# Patient Record
Sex: Male | Born: 1970 | Race: Black or African American | Hispanic: No | Marital: Married | Smoking: Current some day smoker
Health system: Southern US, Community
[De-identification: ages and names within clinical notes are randomized; demographics above are authoritative.]

## PROBLEM LIST (undated history)

## (undated) DIAGNOSIS — R519 Headache, unspecified: Secondary | ICD-10-CM

## (undated) DIAGNOSIS — T8859XA Other complications of anesthesia, initial encounter: Secondary | ICD-10-CM

## (undated) DIAGNOSIS — R51 Headache: Secondary | ICD-10-CM

## (undated) DIAGNOSIS — K219 Gastro-esophageal reflux disease without esophagitis: Secondary | ICD-10-CM

## (undated) DIAGNOSIS — IMO0001 Reserved for inherently not codable concepts without codable children: Secondary | ICD-10-CM

## (undated) DIAGNOSIS — I219 Acute myocardial infarction, unspecified: Secondary | ICD-10-CM

## (undated) DIAGNOSIS — Z9889 Other specified postprocedural states: Secondary | ICD-10-CM

## (undated) DIAGNOSIS — R112 Nausea with vomiting, unspecified: Secondary | ICD-10-CM

## (undated) DIAGNOSIS — R079 Chest pain, unspecified: Secondary | ICD-10-CM

## (undated) DIAGNOSIS — I10 Essential (primary) hypertension: Secondary | ICD-10-CM

## (undated) DIAGNOSIS — J189 Pneumonia, unspecified organism: Secondary | ICD-10-CM

## (undated) HISTORY — DX: Headache, unspecified: R51.9

## (undated) HISTORY — DX: Headache: R51

## (undated) HISTORY — PX: EYE SURGERY: SHX253

## (undated) HISTORY — PX: ESOPHAGOGASTRODUODENOSCOPY: SHX1529

## (undated) HISTORY — DX: Chest pain, unspecified: R07.9

---

## 1998-06-26 ENCOUNTER — Emergency Department (HOSPITAL_COMMUNITY): Admission: EM | Admit: 1998-06-26 | Discharge: 1998-06-26 | Payer: Self-pay | Admitting: Emergency Medicine

## 1999-02-26 ENCOUNTER — Emergency Department (HOSPITAL_COMMUNITY): Admission: EM | Admit: 1999-02-26 | Discharge: 1999-02-26 | Payer: Self-pay | Admitting: Emergency Medicine

## 1999-02-26 ENCOUNTER — Encounter: Payer: Self-pay | Admitting: Emergency Medicine

## 1999-03-03 ENCOUNTER — Emergency Department (HOSPITAL_COMMUNITY): Admission: EM | Admit: 1999-03-03 | Discharge: 1999-03-04 | Payer: Self-pay | Admitting: Emergency Medicine

## 1999-10-30 ENCOUNTER — Emergency Department (HOSPITAL_COMMUNITY): Admission: EM | Admit: 1999-10-30 | Discharge: 1999-10-30 | Payer: Self-pay | Admitting: Emergency Medicine

## 2000-03-07 ENCOUNTER — Emergency Department (HOSPITAL_COMMUNITY): Admission: EM | Admit: 2000-03-07 | Discharge: 2000-03-07 | Payer: Self-pay | Admitting: *Deleted

## 2001-01-09 ENCOUNTER — Emergency Department (HOSPITAL_COMMUNITY): Admission: EM | Admit: 2001-01-09 | Discharge: 2001-01-09 | Payer: Self-pay | Admitting: Emergency Medicine

## 2001-09-30 ENCOUNTER — Emergency Department (HOSPITAL_COMMUNITY): Admission: EM | Admit: 2001-09-30 | Discharge: 2001-09-30 | Payer: Self-pay | Admitting: Emergency Medicine

## 2002-01-24 ENCOUNTER — Emergency Department (HOSPITAL_COMMUNITY): Admission: EM | Admit: 2002-01-24 | Discharge: 2002-01-24 | Payer: Self-pay | Admitting: Emergency Medicine

## 2002-06-04 ENCOUNTER — Emergency Department (HOSPITAL_COMMUNITY): Admission: EM | Admit: 2002-06-04 | Discharge: 2002-06-05 | Payer: Self-pay | Admitting: Emergency Medicine

## 2002-06-25 ENCOUNTER — Emergency Department (HOSPITAL_COMMUNITY): Admission: EM | Admit: 2002-06-25 | Discharge: 2002-06-25 | Payer: Self-pay | Admitting: Emergency Medicine

## 2003-03-12 ENCOUNTER — Encounter: Payer: Self-pay | Admitting: Emergency Medicine

## 2003-03-12 ENCOUNTER — Emergency Department (HOSPITAL_COMMUNITY): Admission: EM | Admit: 2003-03-12 | Discharge: 2003-03-12 | Payer: Self-pay | Admitting: Emergency Medicine

## 2003-05-05 ENCOUNTER — Encounter: Payer: Self-pay | Admitting: Emergency Medicine

## 2003-05-05 ENCOUNTER — Emergency Department (HOSPITAL_COMMUNITY): Admission: EM | Admit: 2003-05-05 | Discharge: 2003-05-06 | Payer: Self-pay | Admitting: Emergency Medicine

## 2004-01-17 ENCOUNTER — Emergency Department (HOSPITAL_COMMUNITY): Admission: EM | Admit: 2004-01-17 | Discharge: 2004-01-17 | Payer: Self-pay | Admitting: Emergency Medicine

## 2005-06-04 ENCOUNTER — Emergency Department (HOSPITAL_COMMUNITY): Admission: EM | Admit: 2005-06-04 | Discharge: 2005-06-04 | Payer: Self-pay | Admitting: Emergency Medicine

## 2005-09-10 DIAGNOSIS — I219 Acute myocardial infarction, unspecified: Secondary | ICD-10-CM | POA: Insufficient documentation

## 2005-09-10 HISTORY — DX: Acute myocardial infarction, unspecified: I21.9

## 2005-09-23 ENCOUNTER — Emergency Department (HOSPITAL_COMMUNITY): Admission: EM | Admit: 2005-09-23 | Discharge: 2005-09-23 | Payer: Self-pay | Admitting: Emergency Medicine

## 2006-03-31 ENCOUNTER — Emergency Department (HOSPITAL_COMMUNITY): Admission: EM | Admit: 2006-03-31 | Discharge: 2006-03-31 | Payer: Self-pay | Admitting: Emergency Medicine

## 2006-05-04 ENCOUNTER — Emergency Department (HOSPITAL_COMMUNITY): Admission: EM | Admit: 2006-05-04 | Discharge: 2006-05-04 | Payer: Self-pay | Admitting: Family Medicine

## 2007-01-09 ENCOUNTER — Emergency Department (HOSPITAL_COMMUNITY): Admission: EM | Admit: 2007-01-09 | Discharge: 2007-01-09 | Payer: Self-pay | Admitting: Emergency Medicine

## 2007-05-19 ENCOUNTER — Emergency Department (HOSPITAL_COMMUNITY): Admission: EM | Admit: 2007-05-19 | Discharge: 2007-05-19 | Payer: Self-pay | Admitting: Emergency Medicine

## 2007-08-08 ENCOUNTER — Emergency Department (HOSPITAL_COMMUNITY): Admission: EM | Admit: 2007-08-08 | Discharge: 2007-08-09 | Payer: Self-pay | Admitting: Emergency Medicine

## 2007-08-20 ENCOUNTER — Ambulatory Visit (HOSPITAL_COMMUNITY): Admission: RE | Admit: 2007-08-20 | Discharge: 2007-08-20 | Payer: Self-pay | Admitting: Cardiology

## 2007-09-07 ENCOUNTER — Emergency Department (HOSPITAL_COMMUNITY): Admission: EM | Admit: 2007-09-07 | Discharge: 2007-09-07 | Payer: Self-pay | Admitting: Emergency Medicine

## 2007-09-08 ENCOUNTER — Ambulatory Visit (HOSPITAL_COMMUNITY): Admission: RE | Admit: 2007-09-08 | Discharge: 2007-09-08 | Payer: Self-pay | Admitting: Cardiology

## 2007-09-08 HISTORY — PX: CARDIAC CATHETERIZATION: SHX172

## 2008-06-29 ENCOUNTER — Emergency Department (HOSPITAL_COMMUNITY): Admission: EM | Admit: 2008-06-29 | Discharge: 2008-06-29 | Payer: Self-pay | Admitting: Emergency Medicine

## 2009-04-18 ENCOUNTER — Emergency Department (HOSPITAL_BASED_OUTPATIENT_CLINIC_OR_DEPARTMENT_OTHER): Admission: EM | Admit: 2009-04-18 | Discharge: 2009-04-18 | Payer: Self-pay | Admitting: Emergency Medicine

## 2009-04-18 ENCOUNTER — Ambulatory Visit: Payer: Self-pay | Admitting: Radiology

## 2011-01-23 NOTE — Cardiovascular Report (Signed)
NAME:  Liu, Noah NO.:  [id number]   MEDICAL RECORD NO.:  [id number]          PATIENT TYPE:  OIB   LOCATION:  2899                         FACILITY:  [facility]   PHYSICIAN:  Ritta Slot, MD     DATE OF BIRTH:  [birth year]   DATE OF PROCEDURE:  09/08/2007  DATE OF DISCHARGE:                            CARDIAC CATHETERIZATION   DIAGNOSTIC CARDIAC CATHETERIZATION PERFORMED AT [facility]   INDICATIONS:  Mr. Ottavio Norem is a 40 year old African American  gentleman who is a heavy smoker with complaint of chest pain for the  past month.  He underwent nuclear perfusion stress testing at  [facility], which did demonstrate a small  area of stress-induced ischemia anterior wall and the mid portion with  mild thinning of the inferior wall and septal hypokinesis, which was  nonspecific.  The calculated ejection fraction was 77%.  In view of the  fact that he is a smoker with chest pain and abnormal stress test, he  was brought to the cardiac cauterization laboratory for further  investigation of coronary disease.   PROCEDURES PERFORMED:  1. Retrograde left heart catheterization.  2. Selective visualization of the coronary arteries.  3. Left ventriculography.   APPROACH:  RFA.   ANESTHESIA:  Local with 10 mL of 1% lidocaine.  Sedation used 1 mg of  Versed, 25 mcg of fentanyl, intravenously.   PROCEDURE:  The patient was prepared and draped in the usual sterile  fashion.  The right groin was shaved and prepared with antibacterial  solution.  It was numbed with 10 mL of 1% lidocaine over the RFA. The  RFA was accessed using a Cook needle x1 stick.  A J-wire was passed  through the Noble needle into the RFA.  The needle was then removed and  over the J-wire was placed a 6 [place] sheath.  Through the 6 French  sheath was placed the JL4 catheter that was engaged into the left main  coronary artery.  The left main coronary artery  system was visualized in  the AP cranial, AP caudal and LAO cranial views.  The JL4 catheter was  then exchanged over the J-wire for a JR4 catheter.  The JR4 catheter was  engaged into the RCA.  The RCA origin was found to be high anterior  takeoff.  The RCA was visualized in the LAO cranial and RAO cranial  views.  The JR4 catheter was then exchanged over the J-wire for an  angled pigtail.  The angled pigtail was placed into the left ventricle  without difficulty.  Contrast of 40 mL was injected at the rate of 13 mL  a second into the left ventricle.  The left ventricle was visualized in  RAO oblique 45-degree ankle.   FINDINGS:  1. Pressures.  The AO pressure was 96/64/6/80.  LV pressure was      96/11/08/09.  The PBV was 96/6/12.  The PBA was 97/65/82.  No      gradient was found across the aortic valve by pullback technique.  2. Coronary arteries:  The right coronary artery was found  to be small      but long and dominant in supplying the PDA.  There is no      significant disease in the right coronary artery.  The left main      coronary artery was large and bifurcated into the left circumflex      and a left anterior descending artery.  The left main was free of      any disease.  The LAD artery was long, wrapped around the apex and      was free of any disease.  It gave rise to a large diagonal.  It was      also free of any disease.  The left circumflex system was large and      also codominant.  It gave rise to a large OM branch.  Neither of      these vessels had any significant coronary artery disease in them.  3. Left ventriculography demonstrated a normal ejection fraction with      EF of 65%.  Mitral regurgitation was noted and no wall motion      abnormality was seen.   IMPRESSION:  A 40 year old gentleman with essentially normal coronary  arteries and normal left ventricular function.   PLAN:  He can go home later today.  I will follow up with him in the  office in 1  week.      Ritta Slot, MD  Electronically Signed     HS/MEDQ  D:  09/08/2007  T:  09/08/2007  Job:  [id number]

## 2011-06-12 LAB — POCT RAPID STREP A: Streptococcus, Group A Screen (Direct): NEGATIVE

## 2011-06-19 LAB — DIFFERENTIAL
Basophils Relative: 1
Eosinophils Relative: 4
Lymphocytes Relative: 53 — ABNORMAL HIGH
Lymphs Abs: 3.5
Monocytes Absolute: 0.5
Neutrophils Relative %: 35 — ABNORMAL LOW

## 2011-06-19 LAB — RAPID URINE DRUG SCREEN, HOSP PERFORMED
Amphetamines: NOT DETECTED
Barbiturates: NOT DETECTED
Benzodiazepines: NOT DETECTED
Cocaine: NOT DETECTED
Tetrahydrocannabinol: NOT DETECTED

## 2011-06-19 LAB — D-DIMER, QUANTITATIVE: D-Dimer, Quant: <0.22

## 2011-06-19 LAB — CBC
MCHC: 33.3
MCV: 83
RDW: 13.9

## 2011-06-19 LAB — URINALYSIS, ROUTINE W REFLEX MICROSCOPIC
Nitrite: NEGATIVE
Specific Gravity, Urine: 1.011

## 2011-06-19 LAB — BASIC METABOLIC PANEL
CO2: 27
Chloride: 101
GFR calc Af Amer: >60
Glucose, Bld: 95
Potassium: 3.8
Sodium: 139

## 2011-06-19 LAB — POCT CARDIAC MARKERS
CKMB, poc: <1 — ABNORMAL LOW
Myoglobin, poc: 63.1
Troponin i, poc: <0.05

## 2011-06-22 LAB — DIFFERENTIAL
Basophils Absolute: 0
Basophils Relative: 0
Eosinophils Absolute: 0
Eosinophils Relative: 0
Lymphocytes Relative: 10 — ABNORMAL LOW
Monocytes Absolute: 1.8 — ABNORMAL HIGH
Neutro Abs: 16.8 — ABNORMAL HIGH
Neutrophils Relative %: 82 — ABNORMAL HIGH

## 2011-06-22 LAB — CBC
HCT: 39.9
Platelets: 206
RDW: 13.6
WBC: 20.6 — ABNORMAL HIGH

## 2011-06-22 LAB — HIV ANTIBODY (ROUTINE TESTING W REFLEX): HIV: NONREACTIVE

## 2011-10-16 ENCOUNTER — Encounter (HOSPITAL_BASED_OUTPATIENT_CLINIC_OR_DEPARTMENT_OTHER): Payer: Self-pay | Admitting: *Deleted

## 2011-10-16 ENCOUNTER — Emergency Department (INDEPENDENT_AMBULATORY_CARE_PROVIDER_SITE_OTHER): Payer: Medicaid Other

## 2011-10-16 ENCOUNTER — Emergency Department (HOSPITAL_BASED_OUTPATIENT_CLINIC_OR_DEPARTMENT_OTHER)
Admission: EM | Admit: 2011-10-16 | Discharge: 2011-10-16 | Disposition: A | Discharge status: Home / Self Care | Payer: Medicaid Other | Attending: Emergency Medicine | Admitting: Emergency Medicine

## 2011-10-16 DIAGNOSIS — R0602 Shortness of breath: Secondary | ICD-10-CM | POA: Insufficient documentation

## 2011-10-16 DIAGNOSIS — K219 Gastro-esophageal reflux disease without esophagitis: Secondary | ICD-10-CM | POA: Insufficient documentation

## 2011-10-16 DIAGNOSIS — R509 Fever, unspecified: Secondary | ICD-10-CM

## 2011-10-16 DIAGNOSIS — R05 Cough: Secondary | ICD-10-CM

## 2011-10-16 DIAGNOSIS — J111 Influenza due to unidentified influenza virus with other respiratory manifestations: Secondary | ICD-10-CM | POA: Insufficient documentation

## 2011-10-16 HISTORY — DX: Acute myocardial infarction, unspecified: I21.9

## 2011-10-16 HISTORY — DX: Gastro-esophageal reflux disease without esophagitis: K21.9

## 2011-10-16 HISTORY — DX: Reserved for inherently not codable concepts without codable children: IMO0001

## 2011-10-16 LAB — URINALYSIS, ROUTINE W REFLEX MICROSCOPIC
Glucose, UA: NEGATIVE mg/dL
Hgb urine dipstick: NEGATIVE
Ketones, ur: 40 mg/dL — AB
Leukocytes, UA: NEGATIVE
pH: 6.5 (ref 5.0–8.0)

## 2011-10-16 LAB — DIFFERENTIAL
Eosinophils Absolute: 0 10*3/uL (ref 0.0–0.7)
Eosinophils Relative: 0 % (ref 0–5)
Lymphocytes Relative: 14 % (ref 12–46)
Lymphs Abs: 1.6 10*3/uL (ref 0.7–4.0)
Monocytes Relative: 10 % (ref 3–12)

## 2011-10-16 LAB — CBC
HCT: 40.6 % (ref 39.0–52.0)
Hemoglobin: 13.3 g/dL (ref 13.0–17.0)
MCH: 27.3 pg (ref 26.0–34.0)
MCV: 83.4 fL (ref 78.0–100.0)
Platelets: 176 10*3/uL (ref 150–400)
RBC: 4.87 MIL/uL (ref 4.22–5.81)
WBC: 11.7 10*3/uL — ABNORMAL HIGH (ref 4.0–10.5)

## 2011-10-16 LAB — URINE MICROSCOPIC-ADD ON

## 2011-10-16 MED ORDER — SODIUM CHLORIDE 0.9 % IV BOLUS (SEPSIS)
1000.0000 mL | Freq: Once | INTRAVENOUS | Status: AC
  Administered 2011-10-16: 1000 mL via INTRAVENOUS

## 2011-10-16 MED ORDER — OSELTAMIVIR PHOSPHATE 75 MG PO CAPS
75.0000 mg | ORAL_CAPSULE | Freq: Once | ORAL | Status: AC
  Administered 2011-10-16: 75 mg via ORAL
  Filled 2011-10-16: qty 1

## 2011-10-16 MED ORDER — OSELTAMIVIR PHOSPHATE 75 MG PO CAPS: 75.0000 mg | ORAL_CAPSULE | Freq: Two times a day (BID) | ORAL | Status: AC

## 2011-10-16 MED ORDER — ACETAMINOPHEN 500 MG PO TABS
ORAL_TABLET | ORAL | Status: AC
  Administered 2011-10-16: 1000 mg via ORAL
  Filled 2011-10-16: qty 2

## 2011-10-16 MED ORDER — KETOROLAC TROMETHAMINE 30 MG/ML IJ SOLN
30.0000 mg | Freq: Once | INTRAMUSCULAR | Status: AC
  Administered 2011-10-16: 30 mg via INTRAVENOUS
  Filled 2011-10-16: qty 1

## 2011-10-16 MED ORDER — ACETAMINOPHEN 500 MG PO TABS
1000.0000 mg | ORAL_TABLET | Freq: Once | ORAL | Status: AC
  Administered 2011-10-16: 1000 mg via ORAL

## 2011-10-16 MED ORDER — DEXAMETHASONE SODIUM PHOSPHATE 10 MG/ML IJ SOLN
10.0000 mg | Freq: Once | INTRAMUSCULAR | Status: AC
  Administered 2011-10-16: 10 mg via INTRAVENOUS
  Filled 2011-10-16: qty 1

## 2011-10-16 MED ORDER — IBUPROFEN 800 MG PO TABS: 800.0000 mg | ORAL_TABLET | Freq: Three times a day (TID) | ORAL | Status: AC

## 2011-10-16 NOTE — ED Notes (Signed)
Sore throat, fever, headache and bodyaches for the last 2 weeks.  Using OTC with no relief.

## 2011-10-16 NOTE — ED Notes (Signed)
Pt encouraged to provide urine sample to avoid cath.

## 2011-10-16 NOTE — ED Provider Notes (Signed)
History     CSN: [id number]  Arrival date & time 10/16/11  1821   First MD Initiated Contact with Patient 10/16/11 1841      Chief Complaint  Patient presents with  . Sore Throat    fever    (Consider location/radiation/quality/duration/timing/severity/associated sxs/prior treatment) Patient is a 41 y.o. male presenting with pharyngitis. The history is provided by the patient. No language interpreter was used.  Sore Throat This is a new problem. The current episode started more than 1 week ago (2 weeks ago). The problem occurs constantly. The problem has been gradually worsening. Pertinent negatives include no chest pain, no abdominal pain, no headaches and no shortness of breath. The symptoms are aggravated by swallowing. The symptoms are relieved by nothing. Treatments tried: otc meds. The treatment provided mild relief.    Past Medical History  Diagnosis Date  . Acute MI 2007  . Reflux     Past Surgical History  Procedure Date  . Eye surgery     multiple    No family history on file.  History  Substance Use Topics  . Smoking status: Former Games developer  . Smokeless tobacco: Never Used  . Alcohol Use: No      Review of Systems  Constitutional: Positive for fever, chills, activity change, appetite change and fatigue.  HENT: Positive for congestion, sore throat and rhinorrhea. Negative for neck pain and neck stiffness.   Respiratory: Positive for cough. Negative for shortness of breath.   Cardiovascular: Negative for chest pain and palpitations.  Gastrointestinal: Positive for nausea. Negative for vomiting and abdominal pain.  Genitourinary: Negative for dysuria, urgency, frequency and flank pain.  Musculoskeletal: Positive for myalgias. Negative for back pain and arthralgias.  Neurological: Negative for dizziness, weakness, light-headedness, numbness and headaches.  All other systems reviewed and are negative.    Allergies  Aspirin  Home Medications   Current  Outpatient Rx  Name Route Sig Dispense Refill  . ACETAMINOPHEN 500 MG PO TABS Oral Take 1,000 mg by mouth every 6 (six) hours as needed. For fever    . PANTOPRAZOLE SODIUM 40 MG PO TBEC Oral Take 40 mg by mouth daily.    . IBUPROFEN 800 MG PO TABS Oral Take 1 tablet (800 mg total) by mouth 3 (three) times daily. 21 tablet 0  . OSELTAMIVIR PHOSPHATE 75 MG PO CAPS Oral Take 1 capsule (75 mg total) by mouth every 12 (twelve) hours. 10 capsule 0    BP 118/67  Pulse 90  Temp(Src) 103 F (39.4 C) (Oral)  Resp 18  Ht 6\' 1"  (1.854 m)  Wt 165 lb (74.844 kg)  BMI 21.77 kg/m2  SpO2 100%  Physical Exam  Nursing note and vitals reviewed. Constitutional: He is oriented to person, place, and time. He appears well-developed and well-nourished. No distress.  HENT:  Head: Normocephalic and atraumatic.  Mouth/Throat: Oropharyngeal exudate present.       Oropharyngeal erythema.  No evidence of PTA  Eyes: Conjunctivae and EOM are normal. Pupils are equal, round, and reactive to light.  Neck: Normal range of motion. Neck supple.  Cardiovascular: Regular rhythm, normal heart sounds and intact distal pulses.        Tachycardic rate  Pulmonary/Chest: Effort normal and breath sounds normal. No respiratory distress.  Abdominal: Soft. Bowel sounds are normal. There is no tenderness.  Musculoskeletal: Normal range of motion. He exhibits no tenderness.  Lymphadenopathy:    He has cervical adenopathy.  Neurological: He is alert and oriented to person,  place, and time. No cranial nerve deficit.  Skin: Skin is warm and dry. No rash noted.    ED Course  Procedures (including critical care time)  Labs Reviewed  CBC - Abnormal; Notable for the following:    WBC 11.7 (*)    All other components within normal limits  DIFFERENTIAL - Abnormal; Notable for the following:    Neutro Abs 8.9 (*)    Monocytes Absolute 1.2 (*)    All other components within normal limits  URINALYSIS, ROUTINE W REFLEX MICROSCOPIC  - Abnormal; Notable for the following:    Color, Urine AMBER (*) BIOCHEMICALS MAY BE AFFECTED BY COLOR   Specific Gravity, Urine 1.036 (*)    Bilirubin Urine SMALL (*)    Ketones, ur 40 (*)    Protein, ur 30 (*)    Urobilinogen, UA 4.0 (*)    All other components within normal limits  URINE MICROSCOPIC-ADD ON - Abnormal; Notable for the following:    Squamous Epithelial / LPF FEW (*)    All other components within normal limits  RAPID STREP SCREEN   Dg Chest 2 View  10/16/2011  *RADIOLOGY REPORT*  Clinical Data: Shortness of breath with cough.  CHEST - 2 VIEW  Comparison: 06/29/2008.  Findings:  The heart size and mediastinal contours are within normal limits.  Both lungs are clear.  The visualized skeletal structures are unremarkable. No change from priors.  IMPRESSION: No active cardiopulmonary disease.  Original Report Authenticated By: Elsie Stain, M.D.     1. Influenza-like illness       MDM  Patient cover for influenza. He's had this illness for about 2 weeks in the past 2 days developed high fever. Place on Tamiflu. Received IV fluids and fever control agents. Will be placed on ibuprofen and instructed to alternate with Tylenol for fever control. I encouraged aggressive oral hydration at home. Her to followup with his primary care physician        Dayton Bailiff, MD 10/16/11 2126

## 2012-05-06 ENCOUNTER — Emergency Department (HOSPITAL_BASED_OUTPATIENT_CLINIC_OR_DEPARTMENT_OTHER): Payer: Medicaid Other

## 2012-05-06 ENCOUNTER — Encounter (HOSPITAL_BASED_OUTPATIENT_CLINIC_OR_DEPARTMENT_OTHER): Payer: Self-pay | Admitting: Emergency Medicine

## 2012-05-06 ENCOUNTER — Emergency Department (HOSPITAL_BASED_OUTPATIENT_CLINIC_OR_DEPARTMENT_OTHER)
Admission: EM | Admit: 2012-05-06 | Discharge: 2012-05-06 | Disposition: A | Discharge status: Home / Self Care | Payer: Medicaid Other | Attending: Emergency Medicine | Admitting: Emergency Medicine

## 2012-05-06 DIAGNOSIS — N509 Disorder of male genital organs, unspecified: Secondary | ICD-10-CM | POA: Insufficient documentation

## 2012-05-06 DIAGNOSIS — N5089 Other specified disorders of the male genital organs: Secondary | ICD-10-CM | POA: Insufficient documentation

## 2012-05-06 DIAGNOSIS — N451 Epididymitis: Secondary | ICD-10-CM

## 2012-05-06 DIAGNOSIS — I252 Old myocardial infarction: Secondary | ICD-10-CM | POA: Insufficient documentation

## 2012-05-06 DIAGNOSIS — R109 Unspecified abdominal pain: Secondary | ICD-10-CM | POA: Insufficient documentation

## 2012-05-06 LAB — URINALYSIS, ROUTINE W REFLEX MICROSCOPIC
Ketones, ur: NEGATIVE mg/dL
Leukocytes, UA: NEGATIVE
Nitrite: NEGATIVE
Protein, ur: NEGATIVE mg/dL

## 2012-05-06 MED ORDER — DOXYCYCLINE HYCLATE 100 MG PO CAPS: 100.0000 mg | ORAL_CAPSULE | Freq: Two times a day (BID) | ORAL | Status: AC

## 2012-05-06 MED ORDER — HYDROCODONE-ACETAMINOPHEN 5-325 MG PO TABS: 2.0000 | ORAL_TABLET | ORAL | Status: AC | PRN

## 2012-05-06 NOTE — ED Notes (Signed)
Pt c/o of groin pain and swelling x 2 weeks.  Denies any pain upon urination.

## 2012-05-06 NOTE — ED Provider Notes (Signed)
History     CSN: [id number]  Arrival date & time 05/06/12  1209   First MD Initiated Contact with Patient 05/06/12 1246      Chief Complaint  Patient presents with  . Groin Pain    (Consider location/radiation/quality/duration/timing/severity/associated sxs/prior treatment) Patient is a 41 y.o. male presenting with groin pain. The history is provided by the patient. No language interpreter was used.  Groin Pain This is a new problem. Episode onset: 2 weeks  The problem occurs constantly. The problem has been unchanged. Pertinent negatives include no weakness. Nothing aggravates the symptoms. He has tried nothing for the symptoms. The treatment provided moderate relief.  Pt complains of swelling in right testicle and pain in right testicle  Past Medical History  Diagnosis Date  . Acute MI 2007  . Reflux     Past Surgical History  Procedure Date  . Eye surgery     multiple    No family history on file.  History  Substance Use Topics  . Smoking status: Former Games developer  . Smokeless tobacco: Never Used  . Alcohol Use: No      Review of Systems  Genitourinary: Positive for scrotal swelling and testicular pain. Negative for penile swelling.  Neurological: Negative for weakness.  All other systems reviewed and are negative.    Allergies  Aspirin  Home Medications   Current Outpatient Rx  Name Route Sig Dispense Refill  . ACETAMINOPHEN 500 MG PO TABS Oral Take 1,000 mg by mouth every 6 (six) hours as needed. For fever    . PANTOPRAZOLE SODIUM 40 MG PO TBEC Oral Take 40 mg by mouth daily.      BP 118/78  Pulse 70  Temp 98.8 F (37.1 C) (Oral)  Resp 14  Ht 6\' 1"  (1.854 m)  Wt 165 lb (74.844 kg)  BMI 21.77 kg/m2  SpO2 100%  Physical Exam  Nursing note and vitals reviewed. Constitutional: He appears well-developed and well-nourished.  HENT:  Head: Normocephalic and atraumatic.  Cardiovascular: Normal rate.   Pulmonary/Chest: Effort normal.  Abdominal:  Soft. Bowel sounds are normal.  Genitourinary:       Tender swollen right scrotum,    Musculoskeletal: Normal range of motion.  Neurological: He is alert.  Skin: Skin is warm.  Psychiatric: He has a normal mood and affect.    ED Course  Procedures (including critical care time)   Labs Reviewed  URINALYSIS, ROUTINE W REFLEX MICROSCOPIC   US Scrotum  05/06/2012  *RADIOLOGY REPORT*  Clinical Data:  Right groin pain radiating to the right testicle.  SCROTAL ULTRASOUND DOPPLER ULTRASOUND OF THE TESTICLES  Technique: Complete ultrasound examination of the testicles, epididymis, and other scrotal structures was performed.  Color and spectral Doppler ultrasound were also utilized to evaluate blood flow to the testicles.  Comparison:  None  Findings:  Right testis:  Measures 4.5 x 2.7 x 2.8 cm and appears normal sonographically.  Left testis:  Measures 4.0 x 2.9 x 2.5 cm and appears normal.  Right epididymis:  Normal in size and appearance.  Left epididymis:  7 mm spermatocele or epididymal cyst noted. Otherwise negative.  Hydrocele:  Absent  Varicocele:  Absent  Pulsed Doppler interrogation of both testes demonstrates expected low resistance flow bilaterally.  IMPRESSION:  1.  7 mm spermatocele or epididymal cyst in the left epididymis. Otherwise normal exam.  Normal and symmetric vascularity in the testicles.   Original Report Authenticated By: Dellia Cloud, M.D.    [place] Art/ven Flow  Abd Pelv Doppler  05/06/2012  *RADIOLOGY REPORT*  Clinical Data:  Right groin pain radiating to the right testicle.  SCROTAL ULTRASOUND DOPPLER ULTRASOUND OF THE TESTICLES  Technique: Complete ultrasound examination of the testicles, epididymis, and other scrotal structures was performed.  Color and spectral Doppler ultrasound were also utilized to evaluate blood flow to the testicles.  Comparison:  None  Findings:  Right testis:  Measures 4.5 x 2.7 x 2.8 cm and appears normal sonographically.  Left testis:  Measures  4.0 x 2.9 x 2.5 cm and appears normal.  Right epididymis:  Normal in size and appearance.  Left epididymis:  7 mm spermatocele or epididymal cyst noted. Otherwise negative.  Hydrocele:  Absent  Varicocele:  Absent  Pulsed Doppler interrogation of both testes demonstrates expected low resistance flow bilaterally.  IMPRESSION:  1.  7 mm spermatocele or epididymal cyst in the left epididymis. Otherwise normal exam.  Normal and symmetric vascularity in the testicles.   Original Report Authenticated By: Dellia Cloud, M.D.      No diagnosis found.    MDM  Pt started on doxycycline and given hydrocodone.  Pt referred to Dr. Marcell[facility] for evaluation        Elson Areas, [place] 05/06/12 1538  Lonia Skinner [place], [place] 05/06/12 1540

## 2012-05-07 NOTE — ED Provider Notes (Signed)
Medical screening examination/treatment/procedure(s) were performed by non-physician practitioner and as supervising physician I was immediately available for consultation/collaboration.   Richardean Canal, MD 05/07/12 [phone]

## 2013-07-09 ENCOUNTER — Encounter (HOSPITAL_BASED_OUTPATIENT_CLINIC_OR_DEPARTMENT_OTHER): Payer: Self-pay | Admitting: Emergency Medicine

## 2013-07-09 ENCOUNTER — Emergency Department (HOSPITAL_BASED_OUTPATIENT_CLINIC_OR_DEPARTMENT_OTHER)
Admission: EM | Admit: 2013-07-09 | Discharge: 2013-07-09 | Disposition: A | Discharge status: Home / Self Care | Payer: Medicaid Other | Attending: Emergency Medicine | Admitting: Emergency Medicine

## 2013-07-09 ENCOUNTER — Emergency Department (HOSPITAL_BASED_OUTPATIENT_CLINIC_OR_DEPARTMENT_OTHER): Payer: Medicaid Other

## 2013-07-09 DIAGNOSIS — Z79899 Other long term (current) drug therapy: Secondary | ICD-10-CM | POA: Insufficient documentation

## 2013-07-09 DIAGNOSIS — H9209 Otalgia, unspecified ear: Secondary | ICD-10-CM | POA: Insufficient documentation

## 2013-07-09 DIAGNOSIS — F17200 Nicotine dependence, unspecified, uncomplicated: Secondary | ICD-10-CM | POA: Insufficient documentation

## 2013-07-09 DIAGNOSIS — K219 Gastro-esophageal reflux disease without esophagitis: Secondary | ICD-10-CM | POA: Insufficient documentation

## 2013-07-09 DIAGNOSIS — J029 Acute pharyngitis, unspecified: Secondary | ICD-10-CM | POA: Insufficient documentation

## 2013-07-09 DIAGNOSIS — I252 Old myocardial infarction: Secondary | ICD-10-CM | POA: Insufficient documentation

## 2013-07-09 DIAGNOSIS — R630 Anorexia: Secondary | ICD-10-CM | POA: Insufficient documentation

## 2013-07-09 DIAGNOSIS — B349 Viral infection, unspecified: Secondary | ICD-10-CM

## 2013-07-09 DIAGNOSIS — B9789 Other viral agents as the cause of diseases classified elsewhere: Secondary | ICD-10-CM | POA: Insufficient documentation

## 2013-07-09 LAB — RAPID STREP SCREEN (MED CTR MEBANE ONLY): Streptococcus, Group A Screen (Direct): NEGATIVE

## 2013-07-09 MED ORDER — IBUPROFEN 800 MG PO TABS
800.0000 mg | ORAL_TABLET | Freq: Once | ORAL | Status: AC
  Administered 2013-07-09: 800 mg via ORAL
  Filled 2013-07-09: qty 1

## 2013-07-09 NOTE — ED Notes (Signed)
Patient states he has had a fever and body aches since Monday

## 2013-07-09 NOTE — ED Provider Notes (Signed)
CSN: [id number]     Arrival date & time 07/09/13  8413 History   First MD Initiated Contact with Patient 07/09/13 1013     Chief Complaint  Patient presents with  . Fever   (Consider location/radiation/quality/duration/timing/severity/associated sxs/prior Treatment) HPI Comments: Patient presents with 3 day history of body aches and fever. MAXIMUM TEMPERATURE at home was 103. He's been taking Tylenol and ibuprofen with some relief. Denies any sick contacts. Denies any recent travel. He is aches all throughout his body with a sore throat and ear pain. Denies headache, chest pain, shortness of breath. Does have a dry cough. No bowel pain, nausea or vomiting. No appetite. Denies any rash. No dysuria or hematuria.  The history is provided by the patient.    Past Medical History  Diagnosis Date  . Acute MI 2007  . Reflux    Past Surgical History  Procedure Laterality Date  . Eye surgery      multiple   No family history on file. History  Substance Use Topics  . Smoking status: Current Every Day Smoker  . Smokeless tobacco: Never Used  . Alcohol Use: No    Review of Systems  Constitutional: Positive for fever, activity change, appetite change and fatigue.  HENT: Positive for ear pain, rhinorrhea and sore throat.   Respiratory: Positive for cough. Negative for chest tightness and shortness of breath.   Cardiovascular: Negative for chest pain.  Gastrointestinal: Negative for nausea, vomiting and abdominal pain.  Genitourinary: Negative for dysuria.  Musculoskeletal: Positive for arthralgias and myalgias.  Skin: Negative for rash.  Neurological: Negative for dizziness, weakness and headaches.  A complete 10 system review of systems was obtained and all systems are negative except as noted in the HPI and PMH.    Allergies  Aspirin  Home Medications   Current Outpatient Rx  Name  Route  Sig  Dispense  Refill  . pantoprazole (PROTONIX) 40 MG tablet   Oral   Take 40 mg by  mouth daily.          BP 120/76  Pulse 90  Temp(Src) 100.2 F (37.9 C) (Oral)  Resp 18  Ht 6\' 1"  (1.854 m)  Wt 165 lb (74.844 kg)  BMI 21.77 kg/m2  SpO2 100% Physical Exam  Constitutional: He is oriented to person, place, and time. He appears well-developed and well-nourished. No distress.  HENT:  Head: Normocephalic and atraumatic.  Right Ear: External ear normal.  Left Ear: External ear normal.  Mouth/Throat: Oropharynx is clear and moist. No oropharyngeal exudate.  Eyes: EOM are normal. Pupils are equal, round, and reactive to light.  Neck: Normal range of motion. Neck supple.  No meningismus  Cardiovascular: Normal rate, regular rhythm and normal heart sounds.   No murmur heard. Pulmonary/Chest: Effort normal and breath sounds normal. No respiratory distress.  Abdominal: Soft. There is no tenderness. There is no rebound and no guarding.  Musculoskeletal: Normal range of motion. He exhibits no edema and no tenderness.  Neurological: He is alert and oriented to person, place, and time. No cranial nerve deficit. He exhibits normal muscle tone. Coordination normal.  Skin: Skin is warm.    ED Course  Procedures (including critical care time) Labs Review Labs Reviewed  RAPID STREP SCREEN  CULTURE, GROUP A STREP   Imaging Review Dg Chest 2 View  07/09/2013   CLINICAL DATA:  Fever, body aches, smoker  EXAM: CHEST  2 VIEW  COMPARISON:  10/16/2011  FINDINGS: The heart size and mediastinal contours  are within normal limits. Both lungs are clear. The visualized skeletal structures are unremarkable.  IMPRESSION: No active cardiopulmonary disease.   Electronically Signed   By: Ruel Favors M.D.   On: 07/09/2013 10:46    EKG Interpretation   None       MDM   1. Viral syndrome    Body aches and fever for the past 3 days. Vital stable. No distress. Nontoxic appearing. No meningismus. No recent travel.  Chest x-ray negative. Rapid strep negative. Patient tolerating by  mouth in the ED. Vital stable. Exam consistent with viral process. Is nontoxic-appearing. Will discharge with antipyretics, by mouth hydration, follow PCP. Return precautions discussed.  BP 120/76  Pulse 90  Temp(Src) 100.2 F (37.9 C) (Oral)  Resp 18  Ht 6\' 1"  (1.854 m)  Wt 165 lb (74.844 kg)  BMI 21.77 kg/m2  SpO2 100%   Glynn Octave, MD 07/09/13 1143

## 2013-11-13 ENCOUNTER — Emergency Department (HOSPITAL_BASED_OUTPATIENT_CLINIC_OR_DEPARTMENT_OTHER)
Admission: EM | Admit: 2013-11-13 | Discharge: 2013-11-13 | Disposition: A | Discharge status: Home / Self Care | Payer: Medicaid Other | Attending: Emergency Medicine | Admitting: Emergency Medicine

## 2013-11-13 ENCOUNTER — Emergency Department (HOSPITAL_BASED_OUTPATIENT_CLINIC_OR_DEPARTMENT_OTHER): Payer: Medicaid Other

## 2013-11-13 ENCOUNTER — Encounter (HOSPITAL_BASED_OUTPATIENT_CLINIC_OR_DEPARTMENT_OTHER): Payer: Self-pay | Admitting: Emergency Medicine

## 2013-11-13 DIAGNOSIS — W230XXA Caught, crushed, jammed, or pinched between moving objects, initial encounter: Secondary | ICD-10-CM | POA: Insufficient documentation

## 2013-11-13 DIAGNOSIS — Z87891 Personal history of nicotine dependence: Secondary | ICD-10-CM | POA: Insufficient documentation

## 2013-11-13 DIAGNOSIS — S63613A Unspecified sprain of left middle finger, initial encounter: Secondary | ICD-10-CM

## 2013-11-13 DIAGNOSIS — K219 Gastro-esophageal reflux disease without esophagitis: Secondary | ICD-10-CM | POA: Insufficient documentation

## 2013-11-13 DIAGNOSIS — S6390XA Sprain of unspecified part of unspecified wrist and hand, initial encounter: Secondary | ICD-10-CM | POA: Insufficient documentation

## 2013-11-13 DIAGNOSIS — Y9389 Activity, other specified: Secondary | ICD-10-CM | POA: Insufficient documentation

## 2013-11-13 DIAGNOSIS — I252 Old myocardial infarction: Secondary | ICD-10-CM | POA: Insufficient documentation

## 2013-11-13 DIAGNOSIS — Y929 Unspecified place or not applicable: Secondary | ICD-10-CM | POA: Insufficient documentation

## 2013-11-13 MED ORDER — ACETAMINOPHEN 325 MG PO TABS
650.0000 mg | ORAL_TABLET | Freq: Once | ORAL | Status: AC
  Administered 2013-11-13: 650 mg via ORAL
  Filled 2013-11-13: qty 2

## 2013-11-13 NOTE — ED Notes (Signed)
Patient transported to X-ray 

## 2013-11-13 NOTE — ED Notes (Signed)
Pt reports a 2 week history of left hand pain that started after moving furniture.

## 2013-11-13 NOTE — ED Provider Notes (Signed)
CSN: [id number]     Arrival date & time 11/13/13  [id number] History   First MD Initiated Contact with Patient 11/13/13 [phone]     Chief Complaint  Patient presents with  . Hand Pain      Patient is a 43 y.o. male presenting with hand pain. The history is provided by the patient.  Hand Pain This is a new problem. The current episode started more than 1 week ago. The problem occurs daily. The problem has not changed since onset.Exacerbated by: movement. The symptoms are relieved by rest.  pt reportrts he "smashed" his left middle finger between two boxes over 2 weeks ago.  He has continued pain/swelling in that finger.  No other injuries reported  Past Medical History  Diagnosis Date  . Acute MI 2007  . Reflux    Past Surgical History  Procedure Laterality Date  . Eye surgery      multiple   No family history on file. History  Substance Use Topics  . Smoking status: Former Games developermoker  . Smokeless tobacco: Never Used  . Alcohol Use: No    Review of Systems  Musculoskeletal: Positive for arthralgias and joint swelling.  Neurological: Negative for weakness.      Allergies  Aspirin  Home Medications   Current Outpatient Rx  Name  Route  Sig  Dispense  Refill  . pantoprazole (PROTONIX) 40 MG tablet   Oral   Take 40 mg by mouth daily.          BP 119/79  Pulse 69  Temp(Src) 97.9 F (36.6 C) (Oral)  Resp 18  Ht 6\' 1"  (1.854 m)  Wt 165 lb (74.844 kg)  BMI 21.77 kg/m2  SpO2 100% Physical Exam CONSTITUTIONAL: Well developed/well nourished, pt using phone when I enter, texting without issue HEAD: Normocephalic/atraumatic EYES: EOMI ENMT: Mucous membranes moist NECK: supple no meningeal signs CV: S1/S2 noted, no murmurs/rubs/gallops noted LUNGS: Lungs are clear to auscultation bilaterally, no apparent distress ABDOMEN: soft NEURO: Pt is awake/alert, moves all extremitiesx4. Distal motor/sensory intact on left hand EXTREMITIES: pulses normal, full ROM Tenderness and mild  swelling to left middle finger.  No erythema/crepitus.  There is some limitation in flexion of DIP of left middle finger.  No nail trauma noted.  No deformity noted.  No difficulty with extension of finger No lacerations noted SKIN: warm, color normal PSYCH: no abnormalities of mood noted  ED Course  Procedures Imaging Review Dg Finger Middle Left  11/13/2013   CLINICAL DATA:  Middle finger crushing injury.  EXAM: LEFT MIDDLE FINGER 2+V  COMPARISON:  None.  FINDINGS: There is no evidence of fracture or dislocation. There is no evidence of arthropathy or other focal bone abnormality. Soft tissues are unremarkable.  IMPRESSION: Negative.   Electronically Signed   By: Herbie BaltimoreWalt  Liebkemann M.D.   On: 11/13/2013 09:49   10:05 AM Concern for possible closed tendon injury Pt placed in splint and referred to Hand next week   MDM   Final diagnoses:  Sprain of left middle finger    Nursing notes including past medical history and social history reviewed and considered in documentation xrays reviewed and considered     Joya Gaskinsonald W Danniell Rotundo, MD 11/13/13 1006

## 2015-02-08 ENCOUNTER — Emergency Department (HOSPITAL_BASED_OUTPATIENT_CLINIC_OR_DEPARTMENT_OTHER): Payer: Medicaid Other

## 2015-02-08 ENCOUNTER — Emergency Department (HOSPITAL_BASED_OUTPATIENT_CLINIC_OR_DEPARTMENT_OTHER)
Admission: EM | Admit: 2015-02-08 | Discharge: 2015-02-09 | Disposition: A | Discharge status: Home / Self Care | Payer: Medicaid Other | Attending: Emergency Medicine | Admitting: Emergency Medicine

## 2015-02-08 ENCOUNTER — Encounter (HOSPITAL_BASED_OUTPATIENT_CLINIC_OR_DEPARTMENT_OTHER): Payer: Self-pay | Admitting: Emergency Medicine

## 2015-02-08 DIAGNOSIS — Z79899 Other long term (current) drug therapy: Secondary | ICD-10-CM | POA: Diagnosis not present

## 2015-02-08 DIAGNOSIS — Y9389 Activity, other specified: Secondary | ICD-10-CM | POA: Diagnosis not present

## 2015-02-08 DIAGNOSIS — X58XXXA Exposure to other specified factors, initial encounter: Secondary | ICD-10-CM | POA: Diagnosis not present

## 2015-02-08 DIAGNOSIS — K219 Gastro-esophageal reflux disease without esophagitis: Secondary | ICD-10-CM | POA: Diagnosis not present

## 2015-02-08 DIAGNOSIS — Z87891 Personal history of nicotine dependence: Secondary | ICD-10-CM | POA: Insufficient documentation

## 2015-02-08 DIAGNOSIS — I252 Old myocardial infarction: Secondary | ICD-10-CM | POA: Insufficient documentation

## 2015-02-08 DIAGNOSIS — Y9289 Other specified places as the place of occurrence of the external cause: Secondary | ICD-10-CM | POA: Diagnosis not present

## 2015-02-08 DIAGNOSIS — Y990 Civilian activity done for income or pay: Secondary | ICD-10-CM | POA: Insufficient documentation

## 2015-02-08 DIAGNOSIS — S43422A Sprain of left rotator cuff capsule, initial encounter: Secondary | ICD-10-CM

## 2015-02-08 DIAGNOSIS — S4992XA Unspecified injury of left shoulder and upper arm, initial encounter: Secondary | ICD-10-CM | POA: Diagnosis present

## 2015-02-08 NOTE — ED Notes (Signed)
Pt in c/o L shoulder pain after lifting heavy objects x 4 days ago.

## 2015-02-09 MED ORDER — HYDROCODONE-ACETAMINOPHEN 5-325 MG PO TABS: 1.0000 | ORAL_TABLET | Freq: Four times a day (QID) | ORAL | Status: DC | PRN

## 2015-02-09 NOTE — ED Provider Notes (Signed)
CSN: [id number]     Arrival date & time 02/08/15  2226 History   First MD Initiated Contact with Patient 02/09/15 [phone]     Chief Complaint  Patient presents with  . Shoulder Injury     (Consider location/radiation/quality/duration/timing/severity/associated sxs/prior Treatment) HPI  This is a 44 year old male who was lifting a heavy object 4 days ago at work. As he was lifting it he felt a pain in his left shoulder that he describes as feeling like he tore his shoulder apart. He continues to have moderate to severe pain in the left shoulder worse with movement, notably abduction and internal rotation. There is no associated deformity and no functional or sensory deficit distally. He denies other injury.  Past Medical History  Diagnosis Date  . Acute MI 2007  . Reflux    Past Surgical History  Procedure Laterality Date  . Eye surgery      multiple   History reviewed. No pertinent family history. History  Substance Use Topics  . Smoking status: Former Games developermoker  . Smokeless tobacco: Never Used  . Alcohol Use: No    Review of Systems  All other systems reviewed and are negative.   Allergies  Aspirin  Home Medications   Prior to Admission medications   Medication Sig Start Date End Date Taking? Authorizing Provider  pantoprazole (PROTONIX) 40 MG tablet Take 40 mg by mouth daily.    Historical Provider, MD   BP 127/88 mmHg  Pulse 79  Temp(Src) 98.2 F (36.8 C) (Oral)  Resp 18  Ht 6\' 1"  (1.854 m)  Wt 165 lb (74.844 kg)  BMI 21.77 kg/m2  SpO2 99%   Physical Exam  General: Well-developed, well-nourished male in no acute distress; appearance consistent with age of record HENT: normocephalic; atraumatic Eyes: pupils equal, round and reactive to light; extraocular muscles intact Neck: supple Heart: regular rate and rhythm Lungs: clear to auscultation bilaterally Abdomen: soft; nondistended; nontender Extremities: No deformity; full range of motion except left shoulder;  left shoulder soft tissue tenderness without bony deformity or tenderness, pain on passive range of motion notably on abduction and internal rotation; pulses normal Neurologic: Awake, alert and oriented; motor function intact in all extremities and symmetric; no facial droop Skin: Warm and dry Psychiatric: Normal mood and affect    ED Course  Procedures (including critical care time)   MDM  Nursing notes and vitals signs, including pulse oximetry, reviewed.  Summary of this visit's results, reviewed by myself:  Imaging Studies: Dg Shoulder Left  02/08/2015   CLINICAL DATA:  Left shoulder pain, limited range of motion for 4 days. Post pulling injury while at work.  EXAM: LEFT SHOULDER - 2+ VIEW  COMPARISON:  None.  FINDINGS: No fracture or dislocation. The alignment and joint spaces are maintained. There is no focal soft tissue abnormality.  IMPRESSION: Negative.   Electronically Signed   By: Rubye OaksMelanie  Ehinger M.D.   On: 02/08/2015 23:23   History and exam consistent with an acute rotator cuff tear.     Paula LibraJohn Kerim Statzer, MD 02/09/15 [phone]

## 2015-02-09 NOTE — ED Notes (Signed)
MD at bedside. 

## 2015-12-06 ENCOUNTER — Encounter (HOSPITAL_BASED_OUTPATIENT_CLINIC_OR_DEPARTMENT_OTHER): Payer: Self-pay | Admitting: *Deleted

## 2015-12-06 ENCOUNTER — Emergency Department (HOSPITAL_BASED_OUTPATIENT_CLINIC_OR_DEPARTMENT_OTHER)
Admission: EM | Admit: 2015-12-06 | Discharge: 2015-12-06 | Disposition: A | Discharge status: Home / Self Care | Payer: Medicaid Other | Attending: Emergency Medicine | Admitting: Emergency Medicine

## 2015-12-06 ENCOUNTER — Emergency Department (HOSPITAL_BASED_OUTPATIENT_CLINIC_OR_DEPARTMENT_OTHER): Payer: Medicaid Other

## 2015-12-06 DIAGNOSIS — K219 Gastro-esophageal reflux disease without esophagitis: Secondary | ICD-10-CM | POA: Insufficient documentation

## 2015-12-06 DIAGNOSIS — I252 Old myocardial infarction: Secondary | ICD-10-CM | POA: Diagnosis not present

## 2015-12-06 DIAGNOSIS — J069 Acute upper respiratory infection, unspecified: Secondary | ICD-10-CM

## 2015-12-06 DIAGNOSIS — Z79899 Other long term (current) drug therapy: Secondary | ICD-10-CM | POA: Insufficient documentation

## 2015-12-06 DIAGNOSIS — J029 Acute pharyngitis, unspecified: Secondary | ICD-10-CM | POA: Diagnosis present

## 2015-12-06 DIAGNOSIS — Z87891 Personal history of nicotine dependence: Secondary | ICD-10-CM | POA: Diagnosis not present

## 2015-12-06 LAB — RAPID STREP SCREEN (MED CTR MEBANE ONLY): STREPTOCOCCUS, GROUP A SCREEN (DIRECT): NEGATIVE

## 2015-12-06 MED ORDER — IPRATROPIUM-ALBUTEROL 0.5-2.5 (3) MG/3ML IN SOLN
3.0000 mL | Freq: Four times a day (QID) | RESPIRATORY_TRACT | Status: DC
  Administered 2015-12-06: 3 mL via RESPIRATORY_TRACT
  Filled 2015-12-06: qty 3

## 2015-12-06 MED ORDER — ALBUTEROL SULFATE HFA 108 (90 BASE) MCG/ACT IN AERS: 1.0000 | INHALATION_SPRAY | Freq: Four times a day (QID) | RESPIRATORY_TRACT | Status: DC | PRN

## 2015-12-06 MED ORDER — BENZONATATE 100 MG PO CAPS: 100.0000 mg | ORAL_CAPSULE | Freq: Three times a day (TID) | ORAL | Status: DC

## 2015-12-06 NOTE — ED Notes (Signed)
Sore throat, cough, fever, sob, body aches. Strep exposure.

## 2015-12-06 NOTE — Discharge Instructions (Signed)
Upper Respiratory Infection, Adult Most upper respiratory infections (URIs) are a viral infection of the air passages leading to the lungs. A URI affects the nose, throat, and upper air passages. The most common type of URI is nasopharyngitis and is typically referred to as "the common cold." URIs run their course and usually go away on their own. Most of the time, a URI does not require medical attention, but sometimes a bacterial infection in the upper airways can follow a viral infection. This is called a secondary infection. Sinus and middle ear infections are common types of secondary upper respiratory infections. Bacterial pneumonia can also complicate a URI. A URI can worsen asthma and chronic obstructive pulmonary disease (COPD). Sometimes, these complications can require emergency medical care and may be life threatening.  CAUSES Almost all URIs are caused by viruses. A virus is a type of germ and can spread from one person to another.  RISKS FACTORS You may be at risk for a URI if:   You smoke.   You have chronic heart or lung disease.  You have a weakened defense (immune) system.   You are very young or very old.   You have nasal allergies or asthma.  You work in crowded or poorly ventilated areas.  You work in health care facilities or schools. SIGNS AND SYMPTOMS  Symptoms typically develop 2-3 days after you come in contact with a cold virus. Most viral URIs last 7-10 days. However, viral URIs from the influenza virus (flu virus) can last 14-18 days and are typically more severe. Symptoms may include:   Runny or stuffy (congested) nose.   Sneezing.   Cough.   Sore throat.   Headache.   Fatigue.   Fever.   Loss of appetite.   Pain in your forehead, behind your eyes, and over your cheekbones (sinus pain).  Muscle aches.  DIAGNOSIS  Your health care provider may diagnose a URI by:  Physical exam.  Tests to check that your symptoms are not due to  another condition such as:  Strep throat.  Sinusitis.  Pneumonia.  Asthma. TREATMENT  A URI goes away on its own with time. It cannot be cured with medicines, but medicines may be prescribed or recommended to relieve symptoms. Medicines may help:  Reduce your fever.  Reduce your cough.  Relieve nasal congestion. HOME CARE INSTRUCTIONS   Take medicines only as directed by your health care provider.   Gargle warm saltwater or take cough drops to comfort your throat as directed by your health care provider.  Use a warm mist humidifier or inhale steam from a shower to increase air moisture. This may make it easier to breathe.  Drink enough fluid to keep your urine clear or pale yellow.   Eat soups and other clear broths and maintain good nutrition.   Rest as needed.   Return to work when your temperature has returned to normal or as your health care provider advises. You may need to stay home longer to avoid infecting others. You can also use a face mask and careful hand washing to prevent spread of the virus.  Increase the usage of your inhaler if you have asthma.   Do not use any tobacco products, including cigarettes, chewing tobacco, or electronic cigarettes. If you need help quitting, ask your health care provider. PREVENTION  The best way to protect yourself from getting a cold is to practice good hygiene.   Avoid oral or hand contact with people with cold   symptoms.   Wash your hands often if contact occurs.  There is no clear evidence that vitamin C, vitamin E, echinacea, or exercise reduces the chance of developing a cold. However, it is always recommended to get plenty of rest, exercise, and practice good nutrition.  SEEK MEDICAL CARE IF:   You are getting worse rather than better.   Your symptoms are not controlled by medicine.   You have chills.  You have worsening shortness of breath.  You have brown or red mucus.  You have yellow or brown nasal  discharge.  You have pain in your face, especially when you bend forward.  You have a fever.  You have swollen neck glands.  You have pain while swallowing.  You have white areas in the back of your throat. SEEK IMMEDIATE MEDICAL CARE IF:   You have severe or persistent:  Headache.  Ear pain.  Sinus pain.  Chest pain.  You have chronic lung disease and any of the following:  Wheezing.  Prolonged cough.  Coughing up blood.  A change in your usual mucus.  You have a stiff neck.  You have changes in your:  Vision.  Hearing.  Thinking.  Mood. MAKE SURE YOU:   Understand these instructions.  Will watch your condition.  Will get help right away if you are not doing well or get worse.   This information is not intended to replace advice given to you by your health care provider. Make sure you discuss any questions you have with your health care provider.   Document Released: 02/20/2001 Document Revised: 01/11/2015 Document Reviewed: 12/02/2013 Elsevier Interactive Patient Education 2016 [employer]  

## 2015-12-06 NOTE — ED Notes (Signed)
Pt ambulatory in lobby, no acute distress

## 2015-12-06 NOTE — ED Notes (Signed)
Pt states he has had a sore throat since this weekend. Also c/o body aches, cough, fever. Daughter had strep. Has also been around a lot of kids.

## 2015-12-06 NOTE — ED Provider Notes (Signed)
CSN: [id number]     Arrival date & time 12/06/15  1629 History   First MD Initiated Contact with Patient 12/06/15 1826     Chief Complaint  Patient presents with  . Sore Throat    HPI   45 year old male presents today with complaints of sore throat, body aches, cough and fever. Patient notes symptoms started 3 days ago, and have persisted with no worsening. He reports chest tightness, denies any chest pain, productive cough, abdominal pain, nausea, vomiting, diarrhea, neck stiffness. Patient reports that he's been using Tylenol or ibuprofen at night to help him sleep, has not taking any today. Patient reports that his daughter has been sick and has been working around children lately. Patient reports a remote history of smoking, not currently a smoker no history of pulmonary dysfunction or chronic health conditions.    Past Medical History  Diagnosis Date  . Acute MI (HCC) 2007  . Reflux    Past Surgical History  Procedure Laterality Date  . Eye surgery      multiple   No family history on file. Social History  Substance Use Topics  . Smoking status: Former Games developermoker  . Smokeless tobacco: Never Used  . Alcohol Use: No    Review of Systems  All other systems reviewed and are negative.   Allergies  Aspirin  Home Medications   Prior to Admission medications   Medication Sig Start Date End Date Taking? Authorizing Provider  albuterol (PROVENTIL HFA;VENTOLIN HFA) 108 (90 Base) MCG/ACT inhaler Inhale 1-2 puffs into the lungs every 6 (six) hours as needed for wheezing or shortness of breath. 12/06/15   Eyvonne MechanicJeffrey Benedict Kue, PA-C  HYDROcodone-acetaminophen (NORCO) 5-325 MG per tablet Take 1-2 tablets by mouth every 6 (six) hours as needed (for pain). 02/09/15   John Molpus, MD  pantoprazole (PROTONIX) 40 MG tablet Take 40 mg by mouth daily.    Historical Provider, MD   BP 118/81 mmHg  Pulse 76  Temp(Src) 98.8 F (37.1 C) (Oral)  Resp 18  Ht 6\' 1"  (1.854 m)  Wt 74.844 kg  BMI 21.77  kg/m2  SpO2 100%   Physical Exam  Constitutional: He is oriented to person, place, and time. He appears well-developed and well-nourished.  HENT:  Head: Normocephalic and atraumatic.  Right Ear: Hearing and tympanic membrane normal.  Left Ear: Hearing and tympanic membrane normal.  Mouth/Throat: Uvula is midline, oropharynx is clear and moist and mucous membranes are normal. No oropharyngeal exudate, posterior oropharyngeal edema, posterior oropharyngeal erythema or tonsillar abscesses.  Eyes: Conjunctivae are normal. Pupils are equal, round, and reactive to light. Right eye exhibits no discharge. Left eye exhibits no discharge. No scleral icterus.  Neck: Normal range of motion. No JVD present. No tracheal deviation present.  Pulmonary/Chest: Effort normal. No stridor. No respiratory distress. He has wheezes. He has no rales. He exhibits no tenderness.  Musculoskeletal: He exhibits no edema or tenderness.  Neurological: He is alert and oriented to person, place, and time. Coordination normal.  Skin: Skin is warm and dry. No rash noted. No erythema. No pallor.  Psychiatric: He has a normal mood and affect. His behavior is normal. Judgment and thought content normal.  Nursing note and vitals reviewed.    ED Course  Procedures (including critical care time) Labs Review Labs Reviewed  RAPID STREP SCREEN (NOT AT [facility])  CULTURE, GROUP A STREP [facility])    Imaging Review Dg Chest 2 View  12/06/2015  CLINICAL DATA:  Cough and fever EXAM: CHEST  2 VIEW COMPARISON:  07/09/2013 chest radiograph. FINDINGS: Stable cardiomediastinal silhouette with normal heart size. No pneumothorax. No pleural effusion. Lungs appear clear, with no acute consolidative airspace disease and no pulmonary edema. IMPRESSION: No active cardiopulmonary disease. Electronically Signed   By: Delbert Phenix M.D.   On: 12/06/2015 19:07   I have personally reviewed and evaluated these images and lab results as part of my medical  decision-making.   EKG Interpretation None      MDM   Final diagnoses:  URI (upper respiratory infection)    Labs:Rapid strep  Imaging: DG chest  Consults:  Therapeutics: DuoNeb  Discharge Meds: Albuterol  Assessment/Plan:45 year old male presents today with likely viral URI. He is afebrile, nontoxic in no acute distress. Patient originally had a fever here in the ED, this resolved without intervention. Patient maintaining perfect oxygen saturation in no respiratory distress. Chest x-ray normal. Patient had small amount of wheeze on initial exam this was resolved with DuoNeb here. Patient discharged home with albuterol inhaler, primary care follow-up, strict return depressions. He verbalized understanding and agreement to today's plan.        Eyvonne Mechanic, PA-C 12/06/15 1926  Pricilla Loveless, MD 12/08/15 [phone]

## 2015-12-09 LAB — CULTURE, GROUP A STREP (THRC)

## 2016-06-30 ENCOUNTER — Emergency Department (HOSPITAL_BASED_OUTPATIENT_CLINIC_OR_DEPARTMENT_OTHER)
Admission: EM | Admit: 2016-06-30 | Discharge: 2016-06-30 | Disposition: A | Discharge status: Home / Self Care | Payer: Medicaid Other | Attending: Emergency Medicine | Admitting: Emergency Medicine

## 2016-06-30 ENCOUNTER — Encounter (HOSPITAL_BASED_OUTPATIENT_CLINIC_OR_DEPARTMENT_OTHER): Payer: Self-pay

## 2016-06-30 DIAGNOSIS — Y92410 Unspecified street and highway as the place of occurrence of the external cause: Secondary | ICD-10-CM | POA: Diagnosis not present

## 2016-06-30 DIAGNOSIS — Y9389 Activity, other specified: Secondary | ICD-10-CM | POA: Diagnosis not present

## 2016-06-30 DIAGNOSIS — Z87891 Personal history of nicotine dependence: Secondary | ICD-10-CM | POA: Diagnosis not present

## 2016-06-30 DIAGNOSIS — Y999 Unspecified external cause status: Secondary | ICD-10-CM | POA: Diagnosis not present

## 2016-06-30 DIAGNOSIS — S0502XA Injury of conjunctiva and corneal abrasion without foreign body, left eye, initial encounter: Secondary | ICD-10-CM | POA: Diagnosis not present

## 2016-06-30 DIAGNOSIS — S0592XA Unspecified injury of left eye and orbit, initial encounter: Secondary | ICD-10-CM | POA: Diagnosis present

## 2016-06-30 MED ORDER — TETRACAINE HCL 0.5 % OP SOLN
1.0000 [drp] | Freq: Once | OPHTHALMIC | Status: AC
  Administered 2016-06-30: 2 [drp] via OPHTHALMIC
  Filled 2016-06-30: qty 4

## 2016-06-30 MED ORDER — FLUORESCEIN SODIUM 1 MG OP STRP
1.0000 | ORAL_STRIP | Freq: Once | OPHTHALMIC | Status: AC
  Administered 2016-06-30: 1 via OPHTHALMIC
  Filled 2016-06-30: qty 1
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
## 2016-06-30 NOTE — ED Notes (Signed)
Pt verbalizes understanding of d/c instructions and denies any further needs at this time. 

## 2016-11-25 ENCOUNTER — Encounter (HOSPITAL_BASED_OUTPATIENT_CLINIC_OR_DEPARTMENT_OTHER): Payer: Self-pay

## 2016-11-25 ENCOUNTER — Emergency Department (HOSPITAL_BASED_OUTPATIENT_CLINIC_OR_DEPARTMENT_OTHER)
Admission: EM | Admit: 2016-11-25 | Discharge: 2016-11-26 | Disposition: A | Discharge status: Home / Self Care | Payer: Medicaid Other | Attending: Emergency Medicine | Admitting: Emergency Medicine

## 2016-11-25 DIAGNOSIS — K122 Cellulitis and abscess of mouth: Secondary | ICD-10-CM | POA: Diagnosis not present

## 2016-11-25 DIAGNOSIS — Z87891 Personal history of nicotine dependence: Secondary | ICD-10-CM | POA: Insufficient documentation

## 2016-11-25 DIAGNOSIS — R0602 Shortness of breath: Secondary | ICD-10-CM | POA: Diagnosis present

## 2016-11-25 MED ORDER — DEXAMETHASONE SODIUM PHOSPHATE 4 MG/ML IJ SOLN
10.0000 mg | Freq: Once | INTRAMUSCULAR | Status: DC
  Filled 2016-11-25: qty 3

## 2016-11-25 MED ORDER — DIPHENHYDRAMINE HCL 25 MG PO CAPS
25.0000 mg | ORAL_CAPSULE | Freq: Once | ORAL | Status: AC
  Administered 2016-11-26: 25 mg via ORAL
  Filled 2016-11-25: qty 1

## 2016-11-25 MED ORDER — FAMOTIDINE 20 MG PO TABS
20.0000 mg | ORAL_TABLET | Freq: Once | ORAL | Status: AC
  Administered 2016-11-26: 20 mg via ORAL
  Filled 2016-11-25: qty 1

## 2016-11-25 NOTE — ED Triage Notes (Signed)
Pt reports shortness of breath and difficulty breathing & swallowing x 2 weeks. Pt in no distress in triage. RT in triage to auscultate patient.

## 2016-11-25 NOTE — ED Provider Notes (Signed)
[facility] Provider Note   CSN: [id number] Arrival date & time: 11/25/16  2114   By signing my name below, I, Clovis Pu, attest that this documentation has been prepared under the direction and in the presence of Shon Baton, MD  Electronically Signed: Clovis Pu, ED Scribe. 11/25/16. 11:31 PM.   History   Chief Complaint Chief Complaint  Patient presents with  . Shortness of Breath   The history is provided by the patient. No language interpreter was used.   HPI Comments:  Noah Liu is a 46 y.o. male, with a PMHx of MI and reflux, who presents to the Emergency Department complaining of persistent difficulty swallowing and the sensation of difficulty breathing onset 1 week. He also reports a sore throat with swallowing and a few episodes of vomiting earlier this week. When asked about chest pain the pt states he just cannot "catch his breath" when laying flat and And again describes it as a sensation of unable to catch his breath because of his throat swelling. His symptoms are worse when laying flat. No alleviating factors noted. Pt denies fevers, abdominal pain, nausea, diarrhea, rash, recent weight gain/loss or any other associated symptoms. No known drug allergies noted. Denies any recent new exposures or allergic symptoms. Patient does mention that he feels that his throat feels the same when he had strep throat.  Past Medical History:  Diagnosis Date  . Acute MI 2007  . Reflux     There are no active problems to display for this patient.   Past Surgical History:  Procedure Laterality Date  . EYE SURGERY     multiple    Home Medications    Prior to Admission medications   Medication Sig Start Date End Date Taking? Authorizing Provider  albuterol (PROVENTIL HFA;VENTOLIN HFA) 108 (90 Base) MCG/ACT inhaler Inhale 1-2 puffs into the lungs every 6 (six) hours as needed for wheezing or shortness of breath. 12/06/15   Eyvonne Mechanic, PA-C    amoxicillin-clavulanate (AUGMENTIN) 875-125 MG tablet Take 1 tablet by mouth every 12 (twelve) hours. 11/26/16   Shon Baton, MD  benzonatate (TESSALON) 100 MG capsule Take 1 capsule (100 mg total) by mouth every 8 (eight) hours. 12/06/15   Eyvonne Mechanic, PA-C  diphenhydrAMINE (BENADRYL) 25 mg capsule Take 1 capsule (25 mg total) by mouth every 6 (six) hours as needed. 11/26/16   Shon Baton, MD  famotidine (PEPCID) 20 MG tablet Take 1 tablet (20 mg total) by mouth daily. 11/26/16   Shon Baton, MD  HYDROcodone-acetaminophen (NORCO) 5-325 MG per tablet Take 1-2 tablets by mouth every 6 (six) hours as needed (for pain). 02/09/15   John Molpus, MD  pantoprazole (PROTONIX) 40 MG tablet Take 40 mg by mouth daily.    Historical Provider, MD    Family History No family history on file.  Social History Social History  Substance Use Topics  . Smoking status: Former Games developer  . Smokeless tobacco: Never Used  . Alcohol use No     Allergies   Aspirin   Review of Systems Review of Systems  Constitutional: Negative for fever and unexpected weight change.  HENT: Positive for sore throat and trouble swallowing.   Respiratory: Positive for shortness of breath.   Cardiovascular: Negative for chest pain.  Gastrointestinal: Positive for vomiting. Negative for abdominal pain, diarrhea and nausea.  Skin: Negative for rash.  All other systems reviewed and are negative.  Physical Exam Updated Vital Signs BP Marland Kitchen)  130/95 (BP Location: Left Arm)   Pulse 64   Temp 98.1 F (36.7 C) (Oral)   Resp 18   Ht 6\' 1"  (1.854 m)   Wt 195 lb (88.5 kg)   SpO2 100%   BMI 25.73 kg/m   Physical Exam  Constitutional: He is oriented to person, place, and time. He appears well-developed and well-nourished.  HENT:  Head: Normocephalic and atraumatic.  Mouth/Throat: No oropharyngeal exudate.  Extensive swelling of the uvula, unable to visualize the tip of the uvula, no significant posterior  oropharyngeal erythema, no exudate, no stridor noted, no fullness under the tongue, no trismus  Neck: Neck supple. No tracheal deviation present. No thyromegaly present.  Normal thyroid, no goiter noted  Cardiovascular: Normal rate, regular rhythm and normal heart sounds.   No murmur heard. Pulmonary/Chest: Effort normal and breath sounds normal. No stridor. No respiratory distress. He has no wheezes.  Lymphadenopathy:    He has no cervical adenopathy.  Neurological: He is alert and oriented to person, place, and time.  Skin: Skin is warm and dry.  Psychiatric: He has a normal mood and affect.  Nursing note and vitals reviewed.    ED Treatments / Results  DIAGNOSTIC STUDIES:  Oxygen Saturation is 100% on RA, normal by my interpretation.    COORDINATION OF CARE:  11:14 PM Discussed treatment plan with pt at bedside and pt agreed to plan.  Labs (all labs ordered are listed, but only abnormal results are displayed) Labs Reviewed  RAPID STREP SCREEN (NOT AT [facility])  CULTURE, GROUP A STREP [facility])    EKG  EKG Interpretation  Date/Time:  Sunday November 25 2016 21:23:59 EDT Ventricular Rate:  71 PR Interval:  166 QRS Duration: 76 QT Interval:  356 QTC Calculation: 386 R Axis:   60 Text Interpretation:  Normal sinus rhythm Early repolarization Normal ECG downsloping st segment in III Otherwise no significant change Confirmed by FLOYD MD, Reuel Boom ([id number]) on 11/25/2016 10:10:25 PM       Radiology No results found.  Procedures Procedures (including critical care time)  Medications Ordered in ED Medications  dexamethasone (DECADRON) injection 10 mg (not administered)  diphenhydrAMINE (BENADRYL) capsule 25 mg (25 mg Oral Given 11/26/16 0017)  famotidine (PEPCID) tablet 20 mg (20 mg Oral Given 11/26/16 0017)  dexamethasone (DECADRON) 10 MG/ML injection (10 mg  Given 11/26/16 0017)     Initial Impression / Assessment and Plan / ED Course  I have reviewed the triage vital signs  and the nursing notes.  Pertinent labs & imaging results that were available during my care of the patient were reviewed by me and considered in my medical decision making (see chart for details).     Patient presents with sore throat, throat swelling, and a sensation of difficulty breathing worse with laying flat. He relates this all to his throat symptoms. He is extensive swelling of the uvula consistent with uvulitis.  No other significant or pharyngeal changes. ABCs are intact. Denies allergic symptoms. Patient given Decadron, Benadryl, Pepcid. Strep screen is negative. However, will elect to treat with antibiotics given unclear etiology. On recheck, patient mostly unchanged. Suspect that he will not have improvement from steroids for several more hours. Given that he has no obvious obstructive symptoms at this time, will discharge home with Pepcid, Benadryl, and Augmentin. He was given strict return precautions. He was also given ENT follow-up if symptoms do not improve.  After history, exam, and medical workup I feel the patient has been appropriately medically  screened and is safe for discharge home. Pertinent diagnoses were discussed with the patient. Patient was given return precautions.   Final Clinical Impressions(s) / ED Diagnoses   Final diagnoses:  Uvulitis    New Prescriptions New Prescriptions   AMOXICILLIN-CLAVULANATE (AUGMENTIN) 875-125 MG TABLET    Take 1 tablet by mouth every 12 (twelve) hours.   DIPHENHYDRAMINE (BENADRYL) 25 MG CAPSULE    Take 1 capsule (25 mg total) by mouth every 6 (six) hours as needed.   FAMOTIDINE (PEPCID) 20 MG TABLET    Take 1 tablet (20 mg total) by mouth daily.   I personally performed the services described in this documentation, which was scribed in my presence. The recorded information has been reviewed and is accurate.     Shon Batonourtney F Adelai Achey, MD 11/26/16 [phone]

## 2016-11-26 LAB — RAPID STREP SCREEN (MED CTR MEBANE ONLY): STREPTOCOCCUS, GROUP A SCREEN (DIRECT): NEGATIVE

## 2016-11-26 MED ORDER — DEXAMETHASONE SODIUM PHOSPHATE 10 MG/ML IJ SOLN
INTRAMUSCULAR | Status: AC
  Administered 2016-11-26: 10 mg
  Filled 2016-11-26: qty 1

## 2016-11-26 MED ORDER — AMOXICILLIN-POT CLAVULANATE 875-125 MG PO TABS: 1.0000 | ORAL_TABLET | Freq: Two times a day (BID) | ORAL | 0 refills | Status: DC

## 2016-11-26 MED ORDER — FAMOTIDINE 20 MG PO TABS: 20.0000 mg | ORAL_TABLET | Freq: Every day | ORAL | 0 refills | Status: DC

## 2016-11-26 MED ORDER — DIPHENHYDRAMINE HCL 25 MG PO CAPS: 25.0000 mg | ORAL_CAPSULE | Freq: Four times a day (QID) | ORAL | 0 refills | Status: DC | PRN

## 2016-11-26 NOTE — Discharge Instructions (Signed)
If you develop any new or worsening symptoms including worsening swelling, increasing pain, difficulty swallowing you need to be reevaluated immediately.

## 2016-11-28 LAB — CULTURE, GROUP A STREP (THRC)

## 2017-01-13 ENCOUNTER — Emergency Department (HOSPITAL_BASED_OUTPATIENT_CLINIC_OR_DEPARTMENT_OTHER)
Admission: EM | Admit: 2017-01-13 | Discharge: 2017-01-14 | Disposition: A | Discharge status: Home / Self Care | Payer: Medicaid Other | Attending: Emergency Medicine | Admitting: Emergency Medicine

## 2017-01-13 ENCOUNTER — Encounter (HOSPITAL_BASED_OUTPATIENT_CLINIC_OR_DEPARTMENT_OTHER): Payer: Self-pay | Admitting: *Deleted

## 2017-01-13 DIAGNOSIS — F17210 Nicotine dependence, cigarettes, uncomplicated: Secondary | ICD-10-CM | POA: Diagnosis not present

## 2017-01-13 DIAGNOSIS — K120 Recurrent oral aphthae: Secondary | ICD-10-CM | POA: Diagnosis not present

## 2017-01-13 DIAGNOSIS — J029 Acute pharyngitis, unspecified: Secondary | ICD-10-CM | POA: Diagnosis present

## 2017-01-13 DIAGNOSIS — Z79899 Other long term (current) drug therapy: Secondary | ICD-10-CM | POA: Insufficient documentation

## 2017-01-13 DIAGNOSIS — I252 Old myocardial infarction: Secondary | ICD-10-CM | POA: Diagnosis not present

## 2017-01-13 DIAGNOSIS — B349 Viral infection, unspecified: Secondary | ICD-10-CM | POA: Insufficient documentation

## 2017-01-13 DIAGNOSIS — K121 Other forms of stomatitis: Secondary | ICD-10-CM

## 2017-01-13 LAB — RAPID STREP SCREEN (MED CTR MEBANE ONLY): Streptococcus, Group A Screen (Direct): NEGATIVE

## 2017-01-13 MED ORDER — IBUPROFEN 800 MG PO TABS
800.0000 mg | ORAL_TABLET | Freq: Once | ORAL | Status: AC
  Administered 2017-01-13: 800 mg via ORAL
  Filled 2017-01-13: qty 1

## 2017-01-13 MED ORDER — POLYMYXIN B-TRIMETHOPRIM 10000-0.1 UNIT/ML-% OP SOLN: 1.0000 [drp] | OPHTHALMIC | 0 refills | Status: DC

## 2017-01-13 MED ORDER — IBUPROFEN 600 MG PO TABS: 600.0000 mg | ORAL_TABLET | Freq: Four times a day (QID) | ORAL | 0 refills | Status: DC | PRN

## 2017-01-13 MED ORDER — PENICILLIN G BENZATHINE 1200000 UNIT/2ML IM SUSP
1.2000 10*6.[IU] | Freq: Once | INTRAMUSCULAR | Status: AC
  Administered 2017-01-14: 1.2 10*6.[IU] via INTRAMUSCULAR
  Filled 2017-01-13: qty 2

## 2017-01-13 MED ORDER — HYDROCODONE-ACETAMINOPHEN 7.5-325 MG/15ML PO SOLN
10.0000 mL | Freq: Once | ORAL | Status: AC
Start: 2017-01-13 — End: 2017-01-14
  Administered 2017-01-14: 10 mL via ORAL
  Filled 2017-01-13: qty 15

## 2017-01-13 MED ORDER — MAGIC MOUTHWASH
5.0000 mL | Freq: Once | ORAL | Status: AC
  Administered 2017-01-14: 5 mL via ORAL

## 2017-01-13 NOTE — ED Provider Notes (Signed)
[facility]   CSN: [id number] Arrival date & time: 01/13/17  1954  By signing my name below, I, Rosario Adie, attest that this documentation has been prepared under the direction and in the presence of Horton, Mayer Masker, MD. Electronically Signed: Rosario Adie, ED Scribe. 01/13/17. 11:05 PM.  History   Chief Complaint Chief Complaint  Patient presents with  . Sore Throat   The history is provided by the patient. No language interpreter was used.   HPI Comments: Noah Liu is a 46 y.o. male w/ a h/o GERD, who presents to the Emergency Department complaining of worsening mouth sores which has been ongoing for approximately 2-3 months, worsening this week. He also notes associated sore throat beginning over the past week, and he was also noted to have a fever while in the ED (febrile at 100.4). Per pt's wife, pt began vaping tobacco to ween himself off of cigarettes several months ago and his mouth sores began shortly following this. Initially only one mouth sore was present and he initially believed that this was an allergic reaction; however, he stopped this activity and his sores continued despite this. He has been taking Tylenol without relief of his symptoms. He developed sore throat this week.  His pain is worse with swallowing. He is a former smoker and occasionally drinks alcohol. No sick contacts with similar symptoms. He denies difficulty swallowing, rash, shortness of breath, chest pain, abdominal pain, nausea, vomiting, unexpected weight loss, or any other associated symptoms.   Past Medical History:  Diagnosis Date  . Acute MI (HCC) 2007  . Reflux    There are no active problems to display for this patient.  Past Surgical History:  Procedure Laterality Date  . EYE SURGERY     multiple    Home Medications    Prior to Admission medications   Medication Sig Start Date End Date Taking? Authorizing Provider  albuterol  (PROVENTIL HFA;VENTOLIN HFA) 108 (90 Base) MCG/ACT inhaler Inhale 1-2 puffs into the lungs every 6 (six) hours as needed for wheezing or shortness of breath. 12/06/15   Hedges, Tinnie Gens, PA-C  amoxicillin-clavulanate (AUGMENTIN) 875-125 MG tablet Take 1 tablet by mouth every 12 (twelve) hours. 11/26/16   Horton, Mayer Masker, MD  benzonatate (TESSALON) 100 MG capsule Take 1 capsule (100 mg total) by mouth every 8 (eight) hours. 12/06/15   Hedges, Tinnie Gens, PA-C  diphenhydrAMINE (BENADRYL) 25 mg capsule Take 1 capsule (25 mg total) by mouth every 6 (six) hours as needed. 11/26/16   Horton, Mayer Masker, MD  famotidine (PEPCID) 20 MG tablet Take 1 tablet (20 mg total) by mouth daily. 11/26/16   Horton, Mayer Masker, MD  HYDROcodone-acetaminophen (NORCO) 5-325 MG per tablet Take 1-2 tablets by mouth every 6 (six) hours as needed (for pain). 02/09/15   Molpus, John, MD  ibuprofen (ADVIL,MOTRIN) 600 MG tablet Take 1 tablet (600 mg total) by mouth every 6 (six) hours as needed. 01/13/17   Horton, Mayer Masker, MD  ibuprofen (ADVIL,MOTRIN) 600 MG tablet Take 1 tablet (600 mg total) by mouth every 6 (six) hours as needed. 01/14/17   Horton, Mayer Masker, MD  lidocaine (XYLOCAINE) 2 % solution Use as directed 20 mLs in the mouth or throat as needed for mouth pain. Swish and spit as needed for pain 01/14/17   Horton, Mayer Masker, MD  pantoprazole (PROTONIX) 40 MG tablet Take 40 mg by mouth daily.    [provider]   Family History History  reviewed. No pertinent family history.  Social History Social History  Substance Use Topics  . Smoking status: Former Games developermoker  . Smokeless tobacco: Current User  . Alcohol use No   Allergies   Aspirin  Review of Systems Review of Systems  Constitutional: Positive for fever. Negative for unexpected weight change.  HENT: Positive for mouth sores and sore throat. Negative for trouble swallowing.   Respiratory: Negative for shortness of breath.   Cardiovascular: Negative for chest  pain.  Gastrointestinal: Negative for abdominal pain, nausea and vomiting.  Skin: Negative for rash.  All other systems reviewed and are negative.  Physical Exam Updated Vital Signs BP 131/78 (BP Location: Right Arm)   Pulse 89   Temp (!) 100.4 F (38 C) (Oral)   Resp 18   Ht 6' (1.829 m)   Wt 195 lb (88.5 kg)   SpO2 100%   BMI 26.45 kg/m   Physical Exam  Constitutional: He is oriented to person, place, and time. He appears well-developed and well-nourished. No distress.  HENT:  Head: Normocephalic and atraumatic.  No trismus, sizable ulcer right cheek posteriorly, is mild bilateral tonsillar swelling with tonsillar exudate, uvula midline, mild posterior or pharyngeal erythema  Eyes: Pupils are equal, round, and reactive to light.  Neck: Neck supple.  Cardiovascular: Normal rate, regular rhythm and normal heart sounds.   No murmur heard. Pulmonary/Chest: Effort normal and breath sounds normal. No respiratory distress. He has no wheezes.  Musculoskeletal: He exhibits no edema.  Lymphadenopathy:    He has cervical adenopathy.  Neurological: He is alert and oriented to person, place, and time.  Skin: Skin is warm and dry.  Psychiatric: He has a normal mood and affect.  Nursing note and vitals reviewed.  ED Treatments / Results  DIAGNOSTIC STUDIES: Oxygen Saturation is 100% on RA, normal by my interpretation.   COORDINATION OF CARE: 11:05 PM-Discussed next steps with pt. Pt verbalized understanding and is agreeable with the plan.   Labs (all labs ordered are listed, but only abnormal results are displayed) Labs Reviewed  [facility]CREEN [facility])  CULTURE, GROUP A STREP [facility])   EKG  EKG Interpretation None      Radiology No results found.  Procedures Procedures   Medications Ordered in ED Medications  magic mouthwash oral solution (not administered)  ibuprofen (ADVIL,MOTRIN) tablet 800 mg (800 mg Oral Given 01/13/17 2245)  magic mouthwash (5 mLs  Oral Given 01/14/17 0012)  penicillin g benzathine (BICILLIN LA) 1200000 UNIT/2ML injection 1.2 Million Units (1.2 Million Units Intramuscular Given 01/14/17 0012)  HYDROcodone-acetaminophen (HYCET) 7.5-325 mg/15 ml solution 10 mL (10 mLs Oral Given 01/14/17 0012)   Initial Impression / Assessment and Plan / ED Course  I have reviewed the triage vital signs and the nursing notes.  Pertinent labs & imaging results that were available during my care of the patient were reviewed by me and considered in my medical decision making (see chart for details).     Patient presents with several month history of mouth ulcers. Also reports recent sore throat. Febrile to 100.4. Denies any other infectious symptoms. He does have tonsillar exudate on exam and lymphadenopathy. Technically he is Centor criteria 4 out of 4. Strep screen is negative. However, he was given Bicillin. Given that the mouth ulcers have been ongoing for several months, these may be unrelated; however, he could also have a viral process going on. He denies any other systemic symptoms to suggest HIV or other chronic illness.  Patient was given Magic mouthwash. Discharge with viscous lidocaine to swish and spit. Follow [facility] with [facility].  After history, exam, and medical workup I feel the patient has been appropriately medically screened and is safe for discharge home. Pertinent diagnoses were discussed with the patient. Patient was given return precautions.   Final Clinical Impressions(s) / ED Diagnoses   Final diagnoses:  Pharyngitis with viral syndrome  Mouth ulcer   New Prescriptions Discharge Medication List as of 01/14/2017 12:20 AM    START taking these medications   Details  !! ibuprofen (ADVIL,MOTRIN) 600 MG tablet Take 1 tablet (600 mg total) by mouth every 6 (six) hours as needed., Starting Sun 01/13/2017, Print    !! ibuprofen (ADVIL,MOTRIN) 600 MG tablet Take 1 tablet (600 mg total) by mouth every 6 (six) hours as needed.,  Starting Mon 01/14/2017, Print    lidocaine (XYLOCAINE) 2 % solution Use as directed 20 mLs in the mouth or throat as needed for mouth pain. Swish and spit as needed for pain, Starting Mon 01/14/2017, Print     !! - Potential duplicate medications found. Please discuss with provider.     I personally performed the services described in this documentation, which was scribed in my presence. The recorded information has been reviewed and is accurate.     Shon Baton, MD 01/14/17 [phone]

## 2017-01-13 NOTE — ED Triage Notes (Signed)
Pt states he has noticed sores off and on in his mouth since starting vaping in Jan. Recent increased pain and difficulty swallowing. Tonsils reddened with white patch noted on right.

## 2017-01-13 NOTE — ED Notes (Signed)
This RT saw Pt in waiting room for allergic reaction. Pt in no distress. No swelling noted. Pt states " my breathing is fine, I just have this sore in my mouth" Informed Pt if status changes to ley nurse first know.

## 2017-01-13 NOTE — ED Notes (Signed)
Alert, NAD, calm, interactive, resps e/u, speaking in clear complete sentences, no dyspnea noted, skin W&D, VSS, c/o sore throat, some allergies, fever, sx ongoing for months, worse in last week, onset when started vaping while trying to quit smoking, no sick contacts, (denies: sob, nvd, dizziness or visual changes). [facility]

## 2017-01-14 MED ORDER — MAGIC MOUTHWASH
ORAL | Status: AC
  Filled 2017-01-14: qty 10

## 2017-01-14 MED ORDER — IBUPROFEN 600 MG PO TABS: 600.0000 mg | ORAL_TABLET | Freq: Four times a day (QID) | ORAL | 0 refills | Status: DC | PRN

## 2017-01-14 MED ORDER — LIDOCAINE VISCOUS 2 % MT SOLN: 20.0000 mL | OROMUCOSAL | 0 refills | Status: DC | PRN

## 2017-01-14 NOTE — Discharge Instructions (Signed)
You were seen today for mouth ulcers and sore throat. Given that her sore throat is relatively new and you have pus on her tonsils as well as a low-grade fever, you were treated for strep throat. However, given the mouth ulcers, he may have a viral illness as well. Used viscous lidocaine as needed. Switch and spit.  If you develop any new or worsening symptoms you need to be reevaluated.  If the ulcers in her mouth do not resolve, you may need to be reevaluated for chronic illnesses.

## 2017-01-14 NOTE — ED Notes (Signed)
Pt discharged to home with family. NAD.  

## 2017-01-16 LAB — CULTURE, GROUP A STREP (THRC)

## 2017-05-17 ENCOUNTER — Encounter (HOSPITAL_BASED_OUTPATIENT_CLINIC_OR_DEPARTMENT_OTHER): Payer: Self-pay | Admitting: *Deleted

## 2017-05-17 ENCOUNTER — Emergency Department (HOSPITAL_BASED_OUTPATIENT_CLINIC_OR_DEPARTMENT_OTHER): Payer: Medicaid Other

## 2017-05-17 ENCOUNTER — Emergency Department (HOSPITAL_BASED_OUTPATIENT_CLINIC_OR_DEPARTMENT_OTHER)
Admission: EM | Admit: 2017-05-17 | Discharge: 2017-05-17 | Disposition: A | Discharge status: Home / Self Care | Payer: Medicaid Other | Attending: Emergency Medicine | Admitting: Emergency Medicine

## 2017-05-17 DIAGNOSIS — F17228 Nicotine dependence, chewing tobacco, with other nicotine-induced disorders: Secondary | ICD-10-CM | POA: Insufficient documentation

## 2017-05-17 DIAGNOSIS — Z79899 Other long term (current) drug therapy: Secondary | ICD-10-CM | POA: Diagnosis not present

## 2017-05-17 DIAGNOSIS — R51 Headache: Secondary | ICD-10-CM | POA: Insufficient documentation

## 2017-05-17 DIAGNOSIS — R519 Headache, unspecified: Secondary | ICD-10-CM

## 2017-05-17 DIAGNOSIS — H9203 Otalgia, bilateral: Secondary | ICD-10-CM | POA: Diagnosis not present

## 2017-05-17 MED ORDER — DIPHENHYDRAMINE HCL 50 MG/ML IJ SOLN
25.0000 mg | Freq: Once | INTRAMUSCULAR | Status: AC
  Administered 2017-05-17: 25 mg via INTRAMUSCULAR
  Filled 2017-05-17: qty 1

## 2017-05-17 MED ORDER — METOCLOPRAMIDE HCL 5 MG/ML IJ SOLN
10.0000 mg | Freq: Once | INTRAMUSCULAR | Status: AC
  Administered 2017-05-17: 10 mg via INTRAMUSCULAR
  Filled 2017-05-17: qty 2

## 2017-05-17 NOTE — ED Triage Notes (Signed)
Pain in his ears yesterday. Headache and dizziness x 2 weeks.

## 2017-05-17 NOTE — ED Provider Notes (Signed)
[facility] Provider Note   CSN: [id number] Arrival date & time: 05/17/17  1830     History   Chief Complaint Chief Complaint  Patient presents with  . Otalgia  . Headache    HPI Noah Liu is a 46 y.o. 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 Patient states headaches have been intermittent since then, located behind his eyes, not improved with Tylenol. He states he has no history of headaches prior to this. He also reports bilateral ear pain yesterday. He denies URI symptoms, fever, chills, vision changes, nausea or vomiting, recent unexpected weight loss.   The history is provided by the patient.    Past Medical History:  Diagnosis Date  . Acute MI (HCC) 2007  . Reflux     There are no active problems to display for this patient.   Past Surgical History:  Procedure Laterality Date  . EYE SURGERY     multiple       Home Medications    Prior to Admission medications   Medication Sig Start Date End Date Taking? Authorizing Provider  albuterol (PROVENTIL HFA;VENTOLIN HFA) 108 (90 Base) MCG/ACT inhaler Inhale 1-2 puffs into the lungs every 6 (six) hours as needed for wheezing or shortness of breath. 12/06/15   Hedges, Tinnie GensJeffrey, PA-C  amoxicillin-clavulanate (AUGMENTIN) 875-125 MG tablet Take 1 tablet by mouth every 12 (twelve) hours. 11/26/16   Horton, Mayer Maskerourtney F, MD  benzonatate (TESSALON) 100 MG capsule Take 1 capsule (100 mg total) by mouth every 8 (eight) hours. 12/06/15   Hedges, Tinnie GensJeffrey, PA-C  diphenhydrAMINE (BENADRYL) 25 mg capsule Take 1 capsule (25 mg total) by mouth every 6 (six) hours as needed. 11/26/16   Horton, Mayer Maskerourtney F, MD  famotidine (PEPCID) 20 MG tablet Take 1 tablet (20 mg total) by mouth daily. 11/26/16   Horton, Mayer Maskerourtney F, MD  HYDROcodone-acetaminophen (NORCO) 5-325 MG per tablet  Take 1-2 tablets by mouth every 6 (six) hours as needed (for pain). 02/09/15   Molpus, John, MD  ibuprofen (ADVIL,MOTRIN) 600 MG tablet Take 1 tablet (600 mg total) by mouth every 6 (six) hours as needed. 01/13/17   Horton, Mayer Maskerourtney F, MD  ibuprofen (ADVIL,MOTRIN) 600 MG tablet Take 1 tablet (600 mg total) by mouth every 6 (six) hours as needed. 01/14/17   Horton, Mayer Maskerourtney F, MD  lidocaine (XYLOCAINE) 2 % solution Use as directed 20 mLs in the mouth or throat as needed for mouth pain. Swish and spit as needed for pain 01/14/17   Horton, Mayer Maskerourtney F, MD  pantoprazole (PROTONIX) 40 MG tablet Take 40 mg by mouth daily.    [provider]    Family History No family history on file.  Social History Social History  Substance Use Topics  . Smoking status: Former Games developermoker  . Smokeless tobacco: Current User  . Alcohol use No     Allergies   Aspirin   Review of Systems Review of Systems  Constitutional: Negative for chills and fever.  HENT: Positive for ear pain. Negative for congestion and sore throat.   Eyes: Positive for photophobia. Negative for visual disturbance.  Musculoskeletal: Negative for neck pain and neck stiffness.  Neurological: Positive for headaches. Negative for syncope and numbness.  Psychiatric/Behavioral: Negative for confusion.  All other systems reviewed and are negative.    Physical Exam Updated Vital Signs BP 116/88  Pulse 66   Temp 98.7 F (37.1 C) (Oral)   Resp 20   Ht  (1.854 m)   Wt 79.4 kg (175 lb)   SpO2 99%   BMI 23.09 kg/m   Physical Exam  Constitutional: He appears well-developed and well-nourished. No distress.  HENT:  Head: Normocephalic and atraumatic.  Right Ear: Hearing, tympanic membrane, external ear and ear canal normal.  Left Ear: Hearing, tympanic membrane, external ear and ear canal normal.  Mouth/Throat: Uvula is midline and oropharynx is clear and moist.  Eyes: Conjunctivae and EOM are normal.  Right pupil and iris are  irregularly shaped (chronic) Left pupil round and reactive to light.  Neck: Normal range of motion. Neck supple. No tracheal deviation present.  Cardiovascular: Normal rate, regular rhythm, normal heart sounds and intact distal pulses.   Pulmonary/Chest: Effort normal and breath sounds normal. No stridor.  Abdominal: Soft.  Musculoskeletal: Normal range of motion.  Lymphadenopathy:    He has no cervical adenopathy.  Neurological: He is alert.  Mental Status:  Alert, oriented, thought content appropriate, able to give a coherent history. Speech fluent without evidence of aphasia. Able to follow 2 step commands without difficulty.  Cranial Nerves:  II:  Peripheral visual fields grossly normal, pupils equal, round, reactive to light III,IV, VI: ptosis not present, extra-ocular motions intact bilaterally  V,VII: smile symmetric, facial light touch sensation equal VIII: hearing grossly normal to voice  X: uvula elevates symmetrically  XI: bilateral shoulder shrug symmetric and strong XII: midline tongue extension without fassiculations Motor:  Normal tone. 5/5 in upper and lower extremities bilaterally including strong and equal grip strength and dorsiflexion/plantar flexion Sensory: Pinprick and light touch normal in all extremities.  Deep Tendon Reflexes: 2+ and symmetric in the biceps and patella Cerebellar: normal finger-to-nose with bilateral upper extremities Gait: normal gait and balance CV: distal pulses palpable throughout    Skin: Skin is warm.  Psychiatric: He has a normal mood and affect. His behavior is normal.  Nursing note and vitals reviewed.    ED Treatments / Results  Labs (all labs ordered are listed, but only abnormal results are displayed) Labs Reviewed - No data to display  EKG  EKG Interpretation None       Radiology Ct Head Wo Contrast  Result Date: 05/17/2017 CLINICAL DATA:  Initial evaluation for acute headache, dizziness. EXAM: CT HEAD WITHOUT  CONTRAST TECHNIQUE: Contiguous axial images were obtained from the base of the skull through the vertex without intravenous contrast. COMPARISON:  None. FINDINGS: Brain: Cerebral volume within normal limits for patient age. Prominent parenchymal calcification noted along the right tentorium, of doubtful significance. No evidence for acute intracranial hemorrhage. No findings to suggest acute large vessel territory infarct. No mass lesion, midline shift, or mass effect. Ventricles are normal in size without evidence for hydrocephalus. No extra-axial fluid collection identified. Vascular: No hyperdense vessel identified. Skull: Scalp soft tissues demonstrate no acute abnormality.Calvarium intact. Sinuses/Orbits: Globes and orbital soft tissues are within normal limits. Visualized paranasal sinuses are clear. No mastoid effusion. IMPRESSION: Normal head CT.  No acute intracranial process para Electronically Signed   By: Rise Mu M.D.   On: 05/17/2017 21:35    Procedures Procedures (including critical care time)  Medications Ordered in ED Medications  metoCLOPramide (REGLAN) injection 10 mg (10 mg Intramuscular Given 05/17/17 2113)  diphenhydrAMINE (BENADRYL) injection 25 mg (25 mg Intramuscular Given 05/17/17 2112)     Initial Impression / Assessment and Plan / ED Course  I have reviewed the triage vital signs and the nursing notes.  Pertinent labs & imaging results that were available during my care of the patient were reviewed by me and considered in my medical decision making (see chart for details).     Patient with a new onset of throbbing headache behind his eyes, that has been intermittent for 2 weeks. No focal neuro deficits on exam. ENT exam unremarkable. Patient without history of headaches, for this reason CT head done. CT head negative for acute pathology. Exam not concerning for [facility], [facility], Meningitis, or temporal arteritis. Pt is afebrile with no focal neuro deficits, nuchal  rigidity, or change in vision. Pt is to follow up with PCP to discuss prophylactic medication. Pt verbalizes understand Patient's headache improved with Reglan and Benadryl. Encouraged patient to establish care with PCP and follow up. Discussed symptomatic management.  Discussed results, findings, treatment and follow up. Patient advised of return precautions. Patient verbalized understanding and agreed with plan.   Final Clinical Impressions(s) / ED Diagnoses   Final diagnoses:  Acute nonintractable headache, unspecified headache type  Otalgia of both ears    New Prescriptions New Prescriptions   No medications on file     Russo, [place] N, PA-C 05/17/17 2209    Loren Racer, MD 05/19/17 [phone]

## 2017-05-17 NOTE — Discharge Instructions (Signed)
Please read instructions below. You can take tylenol every 6 hours as needed for headache. Schedule an appointment with your primary care provider to follow up on your headache and discuss preventative treatment. Return to the ER for severely worsening headache, vision changes, fever, weakness or numbness, or new or concerning symptoms.

## 2017-05-17 NOTE — ED Notes (Signed)
Patient transported to CT 

## 2018-03-22 ENCOUNTER — Emergency Department (HOSPITAL_BASED_OUTPATIENT_CLINIC_OR_DEPARTMENT_OTHER)
Admission: EM | Admit: 2018-03-22 | Discharge: 2018-03-22 | Disposition: A | Discharge status: Home / Self Care | Payer: Medicaid Other | Attending: Emergency Medicine | Admitting: Emergency Medicine

## 2018-03-22 ENCOUNTER — Other Ambulatory Visit: Payer: Self-pay

## 2018-03-22 ENCOUNTER — Encounter (HOSPITAL_BASED_OUTPATIENT_CLINIC_OR_DEPARTMENT_OTHER): Payer: Self-pay | Admitting: *Deleted

## 2018-03-22 ENCOUNTER — Emergency Department (HOSPITAL_BASED_OUTPATIENT_CLINIC_OR_DEPARTMENT_OTHER): Payer: Medicaid Other

## 2018-03-22 DIAGNOSIS — I252 Old myocardial infarction: Secondary | ICD-10-CM | POA: Diagnosis not present

## 2018-03-22 DIAGNOSIS — J069 Acute upper respiratory infection, unspecified: Secondary | ICD-10-CM | POA: Diagnosis not present

## 2018-03-22 DIAGNOSIS — Z79899 Other long term (current) drug therapy: Secondary | ICD-10-CM | POA: Diagnosis not present

## 2018-03-22 DIAGNOSIS — H9203 Otalgia, bilateral: Secondary | ICD-10-CM | POA: Diagnosis present

## 2018-03-22 DIAGNOSIS — B9789 Other viral agents as the cause of diseases classified elsewhere: Secondary | ICD-10-CM

## 2018-03-22 DIAGNOSIS — Z87891 Personal history of nicotine dependence: Secondary | ICD-10-CM | POA: Insufficient documentation

## 2018-03-22 LAB — RAPID STREP SCREEN (MED CTR MEBANE ONLY): STREPTOCOCCUS, GROUP A SCREEN (DIRECT): NEGATIVE

## 2018-03-22 MED ORDER — BENZONATATE 100 MG PO CAPS: 100.0000 mg | ORAL_CAPSULE | Freq: Three times a day (TID) | ORAL | 0 refills | Status: DC

## 2018-03-22 MED ORDER — FLUTICASONE PROPIONATE 50 MCG/ACT NA SUSP: 1.0000 | Freq: Every day | NASAL | 2 refills | Status: DC

## 2018-03-22 NOTE — Discharge Instructions (Signed)
You can take Tylenol or Ibuprofen as directed for pain. You can alternate Tylenol and Ibuprofen every 4 hours. If you take Tylenol at 1pm, then you can take Ibuprofen at 5pm. Then you can take Tylenol again at 9pm.   Use Flonase as directed for nasal congestion.  Use Tessalon Perles for cough.  Follow-up with your primary care doctor or the referred [facility] to 4 days for further evaluation.  Return the emergency department for any worsening pain, cough, difficulty breathing, chest pain, fever or any other worsening or concerning symptoms.

## 2018-03-22 NOTE — ED Notes (Signed)
Patient transported to X-ray 

## 2018-03-22 NOTE — ED Triage Notes (Signed)
Pt reports bilateral ear pain, cough, and nasal congestion x 1 week

## 2018-03-22 NOTE — ED Provider Notes (Signed)
[facility] EMERGENCY DEPARTMENT Provider Note   CSN: [id number] Arrival date & time: 03/22/18  2041     History   Chief Complaint Chief Complaint  Patient presents with  . Otalgia    HPI Noah Liu is a 47 y.o. male past medical history of reflux, MI who presents for evaluation of bilateral ear pain, cough, nasal congestion and sore throat x1 week.  Reports that both ears are hurting but right is greater than left.  Still been able to hear without any difficulty.  Also reports that cough is been productive of mucus.  He reports nasal congestion and sore throat.  He does not take any medications for his symptoms.  He has been able to tolerate his secretions p.o. without any difficulty.  Patient denies any fevers, chest pain, difficulty breathing.  The history is provided by the patient.    Past Medical History:  Diagnosis Date  . Acute MI (HCC) 2007  . Reflux     There are no active problems to display for this patient.   Past Surgical History:  Procedure Laterality Date  . EYE SURGERY     multiple        Home Medications    Prior to Admission medications   Medication Sig Start Date End Date Taking? Authorizing Provider  albuterol (PROVENTIL HFA;VENTOLIN HFA) 108 (90 Base) MCG/ACT inhaler Inhale 1-2 puffs into the lungs every 6 (six) hours as needed for wheezing or shortness of breath. 12/06/15   Hedges, Tinnie GensJeffrey, PA-C  amoxicillin-clavulanate (AUGMENTIN) 875-125 MG tablet Take 1 tablet by mouth every 12 (twelve) hours. 11/26/16   Horton, Mayer Maskerourtney F, MD  benzonatate (TESSALON) 100 MG capsule Take 1 capsule (100 mg total) by mouth every 8 (eight) hours. 12/06/15   Hedges, Tinnie GensJeffrey, PA-C  benzonatate (TESSALON) 100 MG capsule Take 1 capsule (100 mg total) by mouth every 8 (eight) hours. 03/22/18   Maxwell CaulLayden, [place] Teters A, PA-C  diphenhydrAMINE (BENADRYL) 25 mg capsule Take 1 capsule (25 mg total) by mouth every 6 (six) hours as needed. 11/26/16   Horton, Mayer Maskerourtney F,  MD  famotidine (PEPCID) 20 MG tablet Take 1 tablet (20 mg total) by mouth daily. 11/26/16   Horton, Mayer Maskerourtney F, MD  fluticasone (FLONASE) 50 MCG/ACT nasal spray Place 1 spray into both nostrils daily. 03/22/18   Maxwell CaulLayden, Lavontae Cornia A, PA-C  HYDROcodone-acetaminophen (NORCO) 5-325 MG per tablet Take 1-2 tablets by mouth every 6 (six) hours as needed (for pain). 02/09/15   Molpus, John, MD  ibuprofen (ADVIL,MOTRIN) 600 MG tablet Take 1 tablet (600 mg total) by mouth every 6 (six) hours as needed. 01/13/17   Horton, Mayer Maskerourtney F, MD  ibuprofen (ADVIL,MOTRIN) 600 MG tablet Take 1 tablet (600 mg total) by mouth every 6 (six) hours as needed. 01/14/17   Horton, Mayer Maskerourtney F, MD  lidocaine (XYLOCAINE) 2 % solution Use as directed 20 mLs in the mouth or throat as needed for mouth pain. Swish and spit as needed for pain 01/14/17   Horton, Mayer Maskerourtney F, MD  pantoprazole (PROTONIX) 40 MG tablet Take 40 mg by mouth daily.    [provider]    Family History No family history on file.  Social History Social History   Tobacco Use  . Smoking status: Former Games developermoker  . Smokeless tobacco: Never Used  Substance Use Topics  . Alcohol use: No  . Drug use: No     Allergies   Aspirin   Review of Systems Review of Systems  Constitutional: Negative  for fever.  HENT: Positive for congestion, ear pain, rhinorrhea and sore throat. Negative for drooling and trouble swallowing.   Respiratory: Positive for cough. Negative for shortness of breath.   Cardiovascular: Negative for chest pain.  Gastrointestinal: Negative for vomiting.  All other systems reviewed and are negative.    Physical Exam Updated Vital Signs BP 117/75 (BP Location: Left Arm)   Pulse 85   Temp 98.5 F (36.9 C) (Oral)   Resp 20   Ht 6\' 1"  (1.854 m)   Wt 79.4 kg (175 lb)   SpO2 100%   BMI 23.09 kg/m   Physical Exam  Constitutional: He appears well-developed and well-nourished.  HENT:  Head: Normocephalic and atraumatic.  Right Ear: No  mastoid tenderness. Tympanic membrane is erythematous.  Left Ear: No mastoid tenderness. Tympanic membrane is erythematous.  Nose: Mucosal edema present.  Mouth/Throat: Uvula is midline and mucous membranes are normal. Posterior oropharyngeal erythema present.  Uvula is midline. No trismus. Airway is patent, phonation is intact. Tonsils are symmetric in appearance.  Patient with slight posterior oropharynx erythema.  No edema.  No evidence of exudates.  Eyes: Conjunctivae and EOM are normal. Right eye exhibits no discharge. Left eye exhibits no discharge. No scleral icterus.  Pulmonary/Chest: Effort normal and breath sounds normal. He has no wheezes. He has no rales.  Lungs clear to auscultation bilaterally.  Symmetric chest rise.  No wheezing, rales, rhonchi.  Neurological: He is alert.  Skin: Skin is warm and dry.  Psychiatric: He has a normal mood and affect. His speech is normal and behavior is normal.  Nursing note and vitals reviewed.    ED Treatments / Results  Labs (all labs ordered are listed, but only abnormal results are displayed) Labs Reviewed  RAPID STREP SCREEN ([facility] ONLY)  CULTURE, GROUP A STREP [facility])    EKG None  Radiology Dg Chest 2 View  Result Date: 03/22/2018 CLINICAL DATA:  47 year-old male c/o ear pain, sore throat and cough x 1 week. EXAM: CHEST - 2 VIEW COMPARISON:  12/06/2015 FINDINGS: The heart size and mediastinal contours are within normal limits. Both lungs are clear. No pleural effusion or pneumothorax. The visualized skeletal structures are unremarkable. IMPRESSION: Normal chest radiographs. Electronically Signed   By: Amie Portland M.D.   On: 03/22/2018 22:01    Procedures Procedures (including critical care time)  Medications Ordered in ED Medications - No data to display   Initial Impression / Assessment and Plan / ED Course  I have reviewed the triage vital signs and the nursing notes.  Pertinent labs & imaging results that were  available during my care of the patient were reviewed by me and considered in my medical decision making (see chart for details).     47 y.o. M who presents for evaluation of 1 week of bilateral ear pain, sore throat, nasal congestion and cough.  No fevers, difficulty breathing, chest pain. Patient is afebrile, non-toxic appearing, sitting comfortably on examination table. Vital signs reviewed and stable.  On exam, bilateral TMs appear erythematous but no evidence of effusion, bulging.  Posterior oropharynx is slightly erythematous with no evidence of exudates or edema.  Consider viral URI versus pharyngitis.  History/physical exam not concerning for Ludwig angina, peritonsillar abscess, mastoiditis.  History/physical exam not concerning for acute otitis media or effusion.  Suspect that they are slightly erythematous secondary to viral URI process and nasal congestion.  Plan to check rapid strep and chest x-ray since symptoms have been  going on for a week.  Strep reviewed.  Negative.  Chest x-ray reviewed.  Negative for any acute infectious etiology.  Vital signs are stable.  Discussed results with patient.  We will plan to give supportive at home therapies to help with symptoms.  Encourage primary care follow-up for further evaluation. Patient had ample opportunity for questions and discussion. All patient's questions were answered with full understanding. Strict return precautions discussed. Patient expresses understanding and agreement to plan.   Final Clinical Impressions(s) / ED Diagnoses   Final diagnoses:  Viral URI with cough    ED Discharge Orders        Ordered    fluticasone (FLONASE) 50 MCG/ACT nasal spray  Daily     03/22/18 2228    benzonatate (TESSALON) 100 MG capsule  Every 8 hours     03/22/18 2228       Maxwell Caul, PA-C 03/23/18 1610    Vanetta Mulders, MD 03/23/18 1528

## 2018-03-25 LAB — CULTURE, GROUP A STREP (THRC)

## 2018-05-22 ENCOUNTER — Emergency Department (HOSPITAL_BASED_OUTPATIENT_CLINIC_OR_DEPARTMENT_OTHER)
Admission: EM | Admit: 2018-05-22 | Discharge: 2018-05-22 | Disposition: A | Discharge status: Home / Self Care | Payer: Medicaid Other | Attending: Emergency Medicine | Admitting: Emergency Medicine

## 2018-05-22 ENCOUNTER — Other Ambulatory Visit: Payer: Self-pay

## 2018-05-22 ENCOUNTER — Encounter (HOSPITAL_BASED_OUTPATIENT_CLINIC_OR_DEPARTMENT_OTHER): Payer: Self-pay

## 2018-05-22 DIAGNOSIS — Z79899 Other long term (current) drug therapy: Secondary | ICD-10-CM | POA: Diagnosis not present

## 2018-05-22 DIAGNOSIS — H66003 Acute suppurative otitis media without spontaneous rupture of ear drum, bilateral: Secondary | ICD-10-CM | POA: Insufficient documentation

## 2018-05-22 DIAGNOSIS — R51 Headache: Secondary | ICD-10-CM | POA: Diagnosis not present

## 2018-05-22 DIAGNOSIS — R519 Headache, unspecified: Secondary | ICD-10-CM

## 2018-05-22 DIAGNOSIS — Z87891 Personal history of nicotine dependence: Secondary | ICD-10-CM | POA: Diagnosis not present

## 2018-05-22 DIAGNOSIS — H9203 Otalgia, bilateral: Secondary | ICD-10-CM | POA: Diagnosis present

## 2018-05-22 MED ORDER — ACETAMINOPHEN 500 MG PO TABS
1000.0000 mg | ORAL_TABLET | Freq: Once | ORAL | Status: AC
  Administered 2018-05-22: 1000 mg via ORAL
  Filled 2018-05-22: qty 2

## 2018-05-22 MED ORDER — AMOXICILLIN-POT CLAVULANATE 875-125 MG PO TABS: 1.0000 | ORAL_TABLET | Freq: Two times a day (BID) | ORAL | 0 refills | Status: AC

## 2018-05-22 NOTE — Discharge Instructions (Addendum)
Today we saw you for ear pain anda recurring headaches.  You don't have any neurological deficits so we think you are safe for discharge home but we have ordered you a neurology followup.  If you don't hear from someone within a week, please call  back and ask which office you were assigned to.  We also think you should see an ear/nose/throat doctor and their phone number is included in this paperwork.  Please call them and schedule a followup.  In the meantime you are being prescribed a week of antibiotics.  Please come back to the ED if you develop any more severe symptoms like paralysis, vision loss, loss of consciousness, etc.

## 2018-05-22 NOTE — ED Triage Notes (Signed)
C/o bilat earache and pain behind both eyes x 2 months-seen here for same-NAD-steady gait

## 2018-05-22 NOTE — ED Provider Notes (Signed)
[facility] EMERGENCY DEPARTMENT Provider Note   CSN: [id number] Arrival date & time: 05/22/18  1723   History   Chief Complaint Chief Complaint  Patient presents with  . Otalgia    HPI Noah Liu is a 47 y.o. male.  HPI  Patient described symptoms more as headache behind ears/eyes than earache.  He says it has been going on ~2wks.  He states he has hearing changes but no discharge, no pain to manipulation of ears.  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  He denies rhinorrhea, febrile symptoms, rashes, cough, shortness of breath, or chest pain.    He has no known medical problems but hasn't been following with a pcp enough to have regular testing.     Past Medical History:  Diagnosis Date  . Acute MI (HCC) 2007  . Reflux     There are no active problems to display for this patient.   Past Surgical History:  Procedure Laterality Date  . EYE SURGERY     multiple        Home Medications    Prior to Admission medications   Medication Sig Start Date End Date Taking? Authorizing Provider  albuterol (PROVENTIL HFA;VENTOLIN HFA) 108 (90 Base) MCG/ACT inhaler Inhale 1-2 puffs into the lungs every 6 (six) hours as needed for wheezing or shortness of breath. 12/06/15   Hedges, Tinnie GensJeffrey, PA-C  amoxicillin-clavulanate (AUGMENTIN) 875-125 MG tablet Take 1 tablet by mouth 2 (two) times daily for 7 days. 05/22/18 05/29/18  Marthenia RollingBland, Joseh Sjogren, DO  benzonatate (TESSALON) 100 MG capsule Take 1 capsule (100 mg total) by mouth every 8 (eight) hours. 12/06/15   Hedges, Tinnie GensJeffrey, PA-C  benzonatate (TESSALON) 100 MG capsule Take 1 capsule (100 mg total) by mouth every 8 (eight) hours. 03/22/18   Maxwell CaulLayden, Lindsey A, PA-C  diphenhydrAMINE (BENADRYL) 25 mg capsule Take 1 capsule (25 mg total) by mouth every 6 (six) hours as needed. 11/26/16   Horton, Mayer Maskerourtney F, MD  famotidine (PEPCID) 20 MG  tablet Take 1 tablet (20 mg total) by mouth daily. 11/26/16   Horton, Mayer Maskerourtney F, MD  fluticasone (FLONASE) 50 MCG/ACT nasal spray Place 1 spray into both nostrils daily. 03/22/18   Maxwell CaulLayden, Lindsey A, PA-C  HYDROcodone-acetaminophen (NORCO) 5-325 MG per tablet Take 1-2 tablets by mouth every 6 (six) hours as needed (for pain). 02/09/15   Molpus, John, MD  ibuprofen (ADVIL,MOTRIN) 600 MG tablet Take 1 tablet (600 mg total) by mouth every 6 (six) hours as needed. 01/13/17   Horton, Mayer Maskerourtney F, MD  ibuprofen (ADVIL,MOTRIN) 600 MG tablet Take 1 tablet (600 mg total) by mouth every 6 (six) hours as needed. 01/14/17   Horton, Mayer Maskerourtney F, MD  lidocaine (XYLOCAINE) 2 % solution Use as directed 20 mLs in the mouth or throat as needed for mouth pain. Swish and spit as needed for pain 01/14/17   Horton, Mayer Maskerourtney F, MD  pantoprazole (PROTONIX) 40 MG tablet Take 40 mg by mouth daily.    [provider]    Family History No family history on file.  Social History Social History   Tobacco Use  . Smoking status: Former Games developermoker  . Smokeless tobacco: Never Used  Substance Use Topics  . Alcohol use: No  . Drug use: No     Allergies   Aspirin   Review of Systems Review of Systems  Constitutional: Negative for activity change, diaphoresis  and fever.  HENT: Positive for ear pain, sinus pressure and sinus pain. Negative for congestion, drooling, ear discharge, facial swelling, nosebleeds, postnasal drip, rhinorrhea, sneezing, sore throat, tinnitus and trouble swallowing.   Eyes: Positive for pain and visual disturbance. Negative for redness.  Respiratory: Negative for cough, chest tightness, shortness of breath, wheezing and stridor.   Cardiovascular: Negative for chest pain.  Gastrointestinal: Negative.   Genitourinary: Negative.   Musculoskeletal: Negative.   Neurological: Positive for headaches. Negative for dizziness, tremors, facial asymmetry, weakness and light-headedness.     Physical  Exam Updated Vital Signs BP 117/76 (BP Location: Left Arm)   Pulse 84   Temp 98.5 F (36.9 C) (Oral)   Resp 18   Ht 6\' 1"  (1.854 m)   Wt 80.3 kg   SpO2 100%   BMI 23.35 kg/m   Physical Exam  Constitutional: He appears well-developed and well-nourished. No distress.  HENT:  Head: Normocephalic.  Bilateral effusion and cerumen accumulation  Eyes: Conjunctivae are normal. Right eye exhibits no discharge. Left eye exhibits no discharge.  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 Neck supple.  Cardiovascular: Normal rate and regular rhythm.  Pulmonary/Chest: Effort normal and breath sounds normal. No respiratory distress.  Abdominal: Soft. There is no tenderness.  Neurological: He is alert. No cranial nerve deficit.  Skin: Skin is warm and dry. He is not diaphoretic.  Psychiatric: He has a normal mood and affect. His behavior is normal. Judgment and thought content normal.     ED Treatments / Results  Labs (all labs ordered are listed, but only abnormal results are displayed) Labs Reviewed - No data to display  EKG None  Radiology No results found.  Procedures Procedures (including critical care time)  Medications Ordered in ED Medications  acetaminophen (TYLENOL) tablet 1,000 mg (1,000 mg Oral Given 05/22/18 1856)     Initial Impression / Assessment and Plan / ED Course  I have reviewed the triage vital signs and the nursing notes.  Pertinent labs & imaging results that were available during my care of the patient were reviewed by me and considered in my medical decision making (see chart for details).   Mild bilateral effusions will be treated with augmentin x7days.  Discussed f/u with patient at ENT and neuro, no nuerological deficits on exam today.  Was going to offer benadryl/compazine but patient is driving home so will offer tylenol 1G.   Return precautions discussed.  Final Clinical Impressions(s) / ED Diagnoses    Final diagnoses:  Acute suppurative otitis media of both ears without spontaneous rupture of tympanic membranes, recurrence not specified  Recurrent headache    ED Discharge Orders         Ordered    Ambulatory referral to Neurology    Comments:  An appointment is requested in approximately: 2 weeks, recurrent headaches   05/22/18 1843    amoxicillin-clavulanate (AUGMENTIN) 875-125 MG tablet  2 times daily     05/22/18 1849           Marthenia Rolling, DO 05/22/18 1916    Melene Plan, DO 05/22/18 2316

## 2018-06-06 DIAGNOSIS — H9202 Otalgia, left ear: Secondary | ICD-10-CM | POA: Insufficient documentation

## 2018-06-06 HISTORY — DX: Otalgia, left ear: H92.02

## 2018-06-19 ENCOUNTER — Other Ambulatory Visit: Payer: Self-pay

## 2018-06-19 ENCOUNTER — Ambulatory Visit: Payer: Medicaid Other | Admitting: Neurology

## 2018-06-19 ENCOUNTER — Encounter: Payer: Self-pay | Admitting: Neurology

## 2018-06-19 VITALS — BP 135/90 | HR 78 | Resp 16 | Ht 73.0 in | Wt 177.0 lb

## 2018-06-19 DIAGNOSIS — H539 Unspecified visual disturbance: Secondary | ICD-10-CM

## 2018-06-19 DIAGNOSIS — R519 Headache, unspecified: Secondary | ICD-10-CM | POA: Insufficient documentation

## 2018-06-19 DIAGNOSIS — G8929 Other chronic pain: Secondary | ICD-10-CM

## 2018-06-19 DIAGNOSIS — H5440 Blindness, one eye, unspecified eye: Secondary | ICD-10-CM

## 2018-06-19 DIAGNOSIS — R51 Headache: Secondary | ICD-10-CM

## 2018-06-19 DIAGNOSIS — G44329 Chronic post-traumatic headache, not intractable: Secondary | ICD-10-CM | POA: Diagnosis not present

## 2018-06-19 HISTORY — DX: Blindness, one eye, unspecified eye: H54.40

## 2018-06-19 HISTORY — DX: Headache, unspecified: R51.9

## 2018-06-19 HISTORY — DX: Unspecified visual disturbance: H53.9

## 2018-06-19 MED ORDER — METHYLPREDNISOLONE 4 MG PO TABS: ORAL_TABLET | ORAL | 0 refills | Status: DC

## 2018-06-19 MED ORDER — MELOXICAM 15 MG PO TABS: 15.0000 mg | ORAL_TABLET | Freq: Every day | ORAL | 1 refills | Status: DC
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
## 2018-06-20 ENCOUNTER — Telehealth: Payer: Self-pay | Admitting: Neurology

## 2018-06-20 NOTE — Telephone Encounter (Signed)
Medicaid patient is scheduled at GI for 06/25/18,. GI obtains the authorization.

## 2018-06-25 ENCOUNTER — Ambulatory Visit
Admission: RE | Admit: 2018-06-25 | Discharge: 2018-06-25 | Disposition: A | Discharge status: Home / Self Care | Payer: Medicaid Other | Source: Ambulatory Visit | Attending: Neurology | Admitting: Neurology

## 2018-06-25 DIAGNOSIS — G8929 Other chronic pain: Secondary | ICD-10-CM

## 2018-06-25 DIAGNOSIS — G44329 Chronic post-traumatic headache, not intractable: Secondary | ICD-10-CM

## 2018-06-25 DIAGNOSIS — R51 Headache: Secondary | ICD-10-CM

## 2018-06-26 ENCOUNTER — Telehealth: Payer: Self-pay | Admitting: *Deleted

## 2018-06-26 NOTE — Telephone Encounter (Signed)
-----   Message from Asa Lente, MD sent at 06/25/2018  6:10 PM EDT ----- Please let him know that the CAT scan of the brain looks normal.

## 2018-06-26 NOTE — Telephone Encounter (Signed)
LMOM with below MRI report.  He does not need to return this call unless he has questions/fim 

## 2018-08-14 ENCOUNTER — Other Ambulatory Visit: Payer: Self-pay

## 2018-08-14 ENCOUNTER — Ambulatory Visit (INDEPENDENT_AMBULATORY_CARE_PROVIDER_SITE_OTHER): Payer: Medicaid Other | Admitting: Neurology

## 2018-08-14 ENCOUNTER — Encounter: Payer: Self-pay | Admitting: Neurology

## 2018-08-14 VITALS — BP 123/85 | HR 68 | Ht 73.0 in | Wt 183.0 lb

## 2018-08-14 DIAGNOSIS — R51 Headache: Secondary | ICD-10-CM

## 2018-08-14 DIAGNOSIS — R519 Headache, unspecified: Secondary | ICD-10-CM

## 2018-08-14 DIAGNOSIS — H539 Unspecified visual disturbance: Secondary | ICD-10-CM | POA: Diagnosis not present

## 2018-08-14 MED ORDER — NORTRIPTYLINE HCL 25 MG PO CAPS: 25.0000 mg | ORAL_CAPSULE | Freq: Every day | ORAL | 5 refills | Status: DC
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
## 2020-10-16 ENCOUNTER — Other Ambulatory Visit: Payer: Self-pay

## 2020-10-16 ENCOUNTER — Encounter (HOSPITAL_BASED_OUTPATIENT_CLINIC_OR_DEPARTMENT_OTHER): Payer: Self-pay | Admitting: Emergency Medicine

## 2020-10-16 ENCOUNTER — Emergency Department (HOSPITAL_BASED_OUTPATIENT_CLINIC_OR_DEPARTMENT_OTHER)
Admission: EM | Admit: 2020-10-16 | Discharge: 2020-10-16 | Disposition: A | Discharge status: Home / Self Care | Payer: No Typology Code available for payment source | Attending: Emergency Medicine | Admitting: Emergency Medicine

## 2020-10-16 DIAGNOSIS — M5442 Lumbago with sciatica, left side: Secondary | ICD-10-CM | POA: Diagnosis not present

## 2020-10-16 DIAGNOSIS — Z87891 Personal history of nicotine dependence: Secondary | ICD-10-CM | POA: Insufficient documentation

## 2020-10-16 DIAGNOSIS — M545 Low back pain, unspecified: Secondary | ICD-10-CM | POA: Diagnosis present

## 2020-10-16 MED ORDER — NAPROXEN 500 MG PO TABS: 500.0000 mg | ORAL_TABLET | Freq: Two times a day (BID) | ORAL | 0 refills | Status: DC

## 2020-10-16 MED ORDER — NAPROXEN 250 MG PO TABS
500.0000 mg | ORAL_TABLET | Freq: Once | ORAL | Status: AC
  Administered 2020-10-16: 500 mg via ORAL
  Filled 2020-10-16: qty 2

## 2020-10-16 MED ORDER — METHOCARBAMOL 500 MG PO TABS
500.0000 mg | ORAL_TABLET | Freq: Once | ORAL | Status: AC
  Administered 2020-10-16: 500 mg via ORAL
  Filled 2020-10-16: qty 1

## 2020-10-16 MED ORDER — METHOCARBAMOL 500 MG PO TABS: 500.0000 mg | ORAL_TABLET | Freq: Two times a day (BID) | ORAL | 0 refills | Status: DC

## 2020-10-16 MED ORDER — LIDOCAINE 5 % EX PTCH
1.0000 | MEDICATED_PATCH | CUTANEOUS | Status: DC
  Administered 2020-10-16: 1 via TRANSDERMAL
  Filled 2020-10-16: qty 1

## 2020-10-16 NOTE — ED Notes (Signed)
Here for c/o lower back pain, at lower left, states approx 5pm today injured or had pain in his back from work. MAE x 4, ambulates w/o assistance, gait very steady

## 2020-10-16 NOTE — ED Notes (Signed)
See EDP assessment; pt ambulatory, VSS

## 2020-10-16 NOTE — ED Provider Notes (Signed)
[facility] EMERGENCY DEPARTMENT Provider Note   CSN: [id number] Arrival date & time: 10/16/20  4315     History Chief Complaint  Patient presents with  . Back Pain    Noah Liu is a 50 y.o. male with a past medical history significant for GERD and history of MI who presents to the ED due to left low back pain that started abruptly after lifting a heavy box just prior arrival.  Back pain radiates down posterior aspect of left lower extremity to knee.  Denies saddle paresthesias, bowel/bladder incontinence, lower extremity numbness/tingling, lower extremity weakness, fever/chills, IV drug use, and history of cancer.  Pain is worse with movement especially ambulation.  No treatment prior to arrival. No history of low back pain.  Denies associated abdominal pain, urinary symptoms, rash, chest pain, shortness of breath.  History obtained from patient and past medical records. No interpreter used during encounter.       Past Medical History:  Diagnosis Date  . Acute MI (HCC) 2007  . Headache   . Reflux     Patient Active Problem List   Diagnosis Date Noted  . Chronic daily headache 06/19/2018  . Visual changes 06/19/2018  . 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 Referred otalgia of left ear 06/06/2018    Past Surgical History:  Procedure Laterality Date  . EYE SURGERY     multiple       Family History  Problem Relation Age of Onset  . Hypertension Mother   . Heart attack Father   . Hypertension Father   . Von Willebrand disease Father   . Hypertension Sister     Social History   Tobacco Use  . Smoking status: Former Games developer  . Smokeless tobacco: Never Used  Vaping Use  . Vaping Use: Some days  Substance Use Topics  . Alcohol use: No  . Drug use: No    Home Medications Prior to Admission medications   Medication Sig Start Date End Date Taking? Authorizing Provider  methocarbamol (ROBAXIN) 500 MG tablet Take 1 tablet (500 mg total) by mouth 2 (two)  times daily. 10/16/20  Yes Tewana Bohlen C, PA-C  naproxen (NAPROSYN) 500 MG tablet Take 1 tablet (500 mg total) by mouth 2 (two) times daily. 10/16/20  Yes Macari Zalesky, Merla Riches, PA-C  ibuprofen (ADVIL,MOTRIN) 600 MG tablet Take 1 tablet (600 mg total) by mouth every 6 (six) hours as needed. 01/13/17   Horton, Mayer Masker, MD  meloxicam (MOBIC) 15 MG tablet Take 1 tablet (15 mg total) by mouth daily. 06/19/18   Sater, Pearletha Furl, MD  nortriptyline (PAMELOR) 25 MG capsule Take 1 capsule (25 mg total) by mouth at bedtime. 08/14/18   Sater, Pearletha Furl, MD  pantoprazole (PROTONIX) 40 MG tablet Take 40 mg by mouth daily.    [provider]    Allergies    Aspirin  Review of Systems   Review of Systems  Musculoskeletal: Positive for back pain and gait problem.  Neurological: Negative for numbness.    Physical Exam Updated Vital Signs BP 128/82 (BP Location: Left Arm)   Pulse 74   Temp 98.4 F (36.9 C) (Oral)   Resp 18   Ht 6\' 1"  (1.854 m)   Wt 83.9 kg   SpO2 100%   BMI 24.41 kg/m   Physical Exam Vitals and nursing note reviewed.  Constitutional:      General: He is not in acute distress.    Appearance: He is not ill-appearing.  HENT:     Head: Normocephalic.  Eyes:     Pupils: Pupils are equal, round, and reactive to light.  Cardiovascular:     Rate and Rhythm: Normal rate and regular rhythm.     Pulses: Normal pulses.     Heart sounds: Normal heart sounds. No murmur heard. No friction rub. No gallop.   Pulmonary:     Effort: Pulmonary effort is normal.     Breath sounds: Normal breath sounds.  Abdominal:     General: Abdomen is flat. Bowel sounds are normal. There is no distension.     Palpations: Abdomen is soft.     Tenderness: There is no abdominal tenderness. There is no guarding or rebound.  Musculoskeletal:     Cervical back: Neck supple.     Comments: No T-spine and L-spine midline tenderness, no stepoff or deformity, reproducible left paraspinal  tenderness No leg edema bilaterally Patient moves all extremities without difficulty. DP/PT pulses 2+ and equal bilaterally Sensation grossly intact bilaterally Strength of knee flexion and extension is 5/5 Plantar and dorsiflexion of ankle 5/5 Able to ambulate without difficulty   Skin:    General: Skin is warm and dry.  Neurological:     General: No focal deficit present.     Mental Status: He is alert.  Psychiatric:        Mood and Affect: Mood normal.        Behavior: Behavior normal.     ED Results / Procedures / Treatments   Labs (all labs ordered are listed, but only abnormal results are displayed) Labs Reviewed - No data to display  EKG None  Radiology No results found.  Procedures Procedures   Medications Ordered in ED Medications  lidocaine (LIDODERM) 5 % 1 patch (1 patch Transdermal Patch Applied 10/16/20 2125)  naproxen (NAPROSYN) tablet 500 mg (500 mg Oral Given 10/16/20 2126)  methocarbamol (ROBAXIN) tablet 500 mg (500 mg Oral Given 10/16/20 2126)    ED Course  I have reviewed the triage vital signs and the nursing notes.  Pertinent labs & imaging results that were available during my care of the patient were reviewed by me and considered in my medical decision making (see chart for details).    MDM Rules/Calculators/A&P                         50 year old male presents to the ED due to left-sided low back pain that started just prior to arrival when lifting a heavy box.  No saddle paresthesias, bowel/bladder incontinence, lower extremity numbness/tingling, lower extremity weakness, fever/chills, history of cancer, and history of IV drug use.  Vitals all within normal limits.  Patient is afebrile, not tachycardic or hypoxic.  Patient in no acute distress and non-ill-appearing.  Physical exam significant for reproducible left lumbar paraspinal tenderness.  No overlying rash to suggest shingles.  No cervical, thoracic, or lumbar midline tenderness.  Naproxen,  Robaxin, and Lidoderm patch given in the ED for symptomatic relief.  Broad differential for back pain considered includes malignancy, disc herniation, spinal epidural abscess, spinal fracture, cauda equina, pyelonephritis, kidney stone, AAA, AD, pancreatitis, PE and PTX.   History without red flags (cancer, IVDU, weakness, saddle anesthesia, trauma, weight loss) and physical exam most consistent with muscular strain. Doubt cauda equina or disc herniation due to lack of saddle anesthesia/bowel or bladder incontinence or urinary retention, normal gait and reassuring physical examination without neurologic deficits. History is not supportive of kidney stone,  AAA, AD, pancreatitis, PE or PTX. Patient has no CVA tenderness or urinary symptoms to suggest pyelonephritis or kidney stone.   Will manage patient conservatively at this time. NSAIDs, back exercises/stretches, heat therapy and follow up with PCP if symptoms do not resolve in 3-4 weeks. Patient offered muscle relaxer for comfort at night. Counseled on need to return to ED for fever, worsening or concerning symptoms. Strict ED precautions discussed with patient. Patient states understanding and agrees to plan. Patient discharged home in no acute distress and stable vitals  Final Clinical Impression(s) / ED Diagnoses Final diagnoses:  Acute left-sided low back pain with left-sided sciatica    Rx / DC Orders ED Discharge Orders         Ordered    naproxen (NAPROSYN) 500 MG tablet  2 times daily        10/16/20 2133    methocarbamol (ROBAXIN) 500 MG tablet  2 times daily        10/16/20 2133           Jesusita Oka 10/16/20 2204    Tegeler, Canary Brim, MD 10/16/20 [phone]

## 2020-10-16 NOTE — Discharge Instructions (Addendum)
As discussed, your low back pain is likely due to a muscular strain after lifting a heavy box.  I am sending you home with a pain medication and muscle relaxer.  Take as needed.  Muscle relaxer can cause drowsiness so do not drive or operate machinery while on the medication.  You may purchase over-the-counter Lidoderm patches and Voltaren gel for added pain relief.  I have included low back exercises.  Perform daily.  Low back pain can take up to 6 weeks for full recovery.  Follow-up with PCP if symptoms not improve over the next week.  Turn to the ER for new or worsening symptoms.

## 2020-10-16 NOTE — ED Triage Notes (Signed)
L low back pain x 30 min after lifting something heavy.

## 2021-09-05 ENCOUNTER — Encounter: Payer: 59 | Admitting: Vascular Surgery

## 2021-09-20 ENCOUNTER — Other Ambulatory Visit: Payer: Self-pay | Admitting: Orthopedic Surgery

## 2021-09-22 ENCOUNTER — Other Ambulatory Visit: Payer: Self-pay

## 2021-10-03 ENCOUNTER — Ambulatory Visit (INDEPENDENT_AMBULATORY_CARE_PROVIDER_SITE_OTHER): Payer: No Typology Code available for payment source | Admitting: Vascular Surgery

## 2021-10-03 ENCOUNTER — Other Ambulatory Visit: Payer: Self-pay

## 2021-10-03 ENCOUNTER — Encounter: Payer: Self-pay | Admitting: Vascular Surgery

## 2021-10-03 VITALS — BP 131/84 | HR 87 | Resp 20 | Ht 73.0 in | Wt 192.0 lb

## 2021-10-03 DIAGNOSIS — M545 Low back pain, unspecified: Secondary | ICD-10-CM | POA: Diagnosis not present

## 2021-10-03 NOTE — Progress Notes (Signed)
[facility] [place]  ASSESSMENT / PLAN: Noah Liu is a 51 y.o. male with plans to undergo L5/S1 anterior lumbar interbody fusion on 10/11/21 with Dr. Lynann Bologna. I have reviewed the patient's imaging and feel it is safe to approach this anteriorly. I discussed the specific risks associated with spine exposure including vascular injury, urethral injury, bowel injury, nervous injury. I explained that if severe vascular injury was encountered we may need to abandon the spinal procedure to safely repair the injury. I explained the typical postoperative course for anterior exposure of the spine, including small risk of postoperative ileus. The patient was understanding and wished to proceed.   CHIEF COMPLAINT: back pain  HISTORY OF PRESENT ILLNESS: Noah Liu is a 51 y.o. male referred to clinic to discuss potential anterior spinal exposure for L5/S1 anterior lumbar interbody fusion.  The patient reports significant back pain which is limiting his function.  He has been offered the above by Dr. Lynann Bologna.  He reports no history of abdominal surgery.  No history of radiotherapy to the abdomen or pelvis.  Past Medical History:  Diagnosis Date   Acute MI ([place]) 2007   Headache    Reflux     Past Surgical History:  Procedure Laterality Date   EYE SURGERY     multiple    Family History  Problem Relation Age of Onset   Hypertension Mother    Heart attack Father    Hypertension Father    Von Willebrand disease Father    Hypertension Sister     Social History   Socioeconomic History   Marital status: Married    Spouse name: Not on file   Number of children: Not on file   Years of education: Not on file   Highest education level: Not on file  Occupational History   Not on file  Tobacco Use   Smoking status: Former   Smokeless tobacco: Never  Vaping Use   Vaping Use: Some days  Substance and Sexual Activity   Alcohol use: No   Drug use: No    Sexual activity: Not on file  Other Topics Concern   Not on file  Social History Narrative   Not on file   Social Determinants of Health   Financial Resource Strain: Not on file  Food Insecurity: Not on file  Transportation Needs: Not on file  Physical Activity: Not on file  Stress: Not on file  Social Connections: Not on file  Intimate Partner Violence: Not on file    Allergies  Allergen Reactions   Aspirin Other (See Comments)    Upset stomach      Current Outpatient Medications  Medication Sig Dispense Refill   ibuprofen (ADVIL,MOTRIN) 600 MG tablet Take 1 tablet (600 mg total) by mouth every 6 (six) hours as needed. (Patient taking differently: Take 400 mg by mouth every 6 (six) hours as needed for mild pain or moderate pain.) 30 tablet 0   methocarbamol (ROBAXIN) 500 MG tablet Take 500 mg by mouth 2 (two) times daily.     NEURONTIN 300 MG capsule Take 300 mg by mouth 2 (two) times daily.     No current facility-administered medications for this visit.    REVIEW OF SYSTEMS:  [X]  denotes positive finding, [ ]  denotes negative finding Cardiac  Comments:  Chest pain or chest pressure:    Shortness of breath upon exertion:    Short of breath when lying flat:    Irregular heart rhythm:  Vascular    Pain in calf, thigh, or hip brought on by ambulation:    Pain in feet at night that wakes you up from your sleep:     Blood clot in your veins:    Leg swelling:         Pulmonary    Oxygen at home:    Productive cough:     Wheezing:         Neurologic    Sudden weakness in arms or legs:     Sudden numbness in arms or legs:     Sudden onset of difficulty speaking or slurred speech:    Temporary loss of vision in one eye:     Problems with dizziness:         Gastrointestinal    Blood in stool:     Vomited blood:         Genitourinary    Burning when urinating:     Blood in urine:        Psychiatric    Major depression:         Hematologic     Bleeding problems:    Problems with blood clotting too easily:        Skin    Rashes or ulcers:        Constitutional    Fever or chills:      PHYSICAL EXAM Vitals:   10/03/21 1514  BP: 131/84  Pulse: 87  Resp: 20  SpO2: 97%  Weight: 192 lb (87.1 kg)  Height: 6\' 1"  (1.854 m)    Constitutional: well appearing. no distress. Appears well nourished.  Neurologic: CN intact. no focal findings. no sensory loss. Psychiatric:  Mood and affect symmetric and appropriate. Eyes:  No icterus. No conjunctival pallor. Ears, nose, throat:  mucous membranes moist. Midline trachea.  Cardiac: regular rate and rhythm.  Respiratory:  unlabored. Abdominal:  soft, non-tender, non-distended.  Extremity: no edema. no cyanosis. no pallor.  Skin: no gangrene. No ulceration.  Lymphatic: no Stemmer's sign. no palpable lymphadenopathy.  PERTINENT LABORATORY AND RADIOLOGIC DATA  Most recent CBC CBC Latest Ref Rng & Units 10/16/2011 08/08/2007 05/19/2007  WBC 4.0 - 10.5 K/uL 11.7(H) 6.7 20.6(H)  Hemoglobin 13.0 - 17.0 g/dL 13.3 12.2(L) 13.3  Hematocrit 39.0 - 52.0 % 40.6 36.5(L) 39.9  Platelets 150 - 400 K/uL 176 178 SPECIMEN CHECKED FOR CLOTS PLATELET COUNT CONFIRMED BY SMEAR 206     Most recent CMP CMP 08/08/2007  Glucose 95  BUN 9  Creatinine 1.13  Sodium 139  Potassium 3.8  Chloride 101  CO2 27  Calcium 8.7   MRI of lumbar spine personally reviewed. No aberrant vascular anatomy. L5/S1 disc space appears safe to approach from an anterior perspective.   Yevonne Aline. Stanford Breed, MD [facility] Phone Number: [phone] 10/03/2021 4:33 PM  Total time spent on preparing this encounter including chart review, data review, collecting history, examining the patient, coordinating care for this new patient, 60 minutes.  Portions of this report may have been transcribed using voice recognition software.  Every effort has been made to ensure accuracy; however,  inadvertent computerized transcription errors may still be present.

## 2021-10-03 NOTE — H&P (View-Only) (Signed)
[facility] [place]  ASSESSMENT / PLAN: Noah Liu is a 51 y.o. male with plans to undergo L5/S1 anterior lumbar interbody fusion on 10/11/21 with Dr. Lynann Bologna. I have reviewed the patient's imaging and feel it is safe to approach this anteriorly. I discussed the specific risks associated with spine exposure including vascular injury, urethral injury, bowel injury, nervous injury. I explained that if severe vascular injury was encountered we may need to abandon the spinal procedure to safely repair the injury. I explained the typical postoperative course for anterior exposure of the spine, including small risk of postoperative ileus. The patient was understanding and wished to proceed.   CHIEF COMPLAINT: back pain  HISTORY OF PRESENT ILLNESS: Noah Liu is a 51 y.o. male referred to clinic to discuss potential anterior spinal exposure for L5/S1 anterior lumbar interbody fusion.  The patient reports significant back pain which is limiting his function.  He has been offered the above by Dr. Lynann Bologna.  He reports no history of abdominal surgery.  No history of radiotherapy to the abdomen or pelvis.  Past Medical History:  Diagnosis Date   Acute MI ([place]) 2007   Headache    Reflux     Past Surgical History:  Procedure Laterality Date   EYE SURGERY     multiple    Family History  Problem Relation Age of Onset   Hypertension Mother    Heart attack Father    Hypertension Father    Von Willebrand disease Father    Hypertension Sister     Social History   Socioeconomic History   Marital status: Married    Spouse name: Not on file   Number of children: Not on file   Years of education: Not on file   Highest education level: Not on file  Occupational History   Not on file  Tobacco Use   Smoking status: Former   Smokeless tobacco: Never  Vaping Use   Vaping Use: Some days  Substance and Sexual Activity   Alcohol use: No   Drug use: No    Sexual activity: Not on file  Other Topics Concern   Not on file  Social History Narrative   Not on file   Social Determinants of Health   Financial Resource Strain: Not on file  Food Insecurity: Not on file  Transportation Needs: Not on file  Physical Activity: Not on file  Stress: Not on file  Social Connections: Not on file  Intimate Partner Violence: Not on file    Allergies  Allergen Reactions   Aspirin Other (See Comments)    Upset stomach      Current Outpatient Medications  Medication Sig Dispense Refill   ibuprofen (ADVIL,MOTRIN) 600 MG tablet Take 1 tablet (600 mg total) by mouth every 6 (six) hours as needed. (Patient taking differently: Take 400 mg by mouth every 6 (six) hours as needed for mild pain or moderate pain.) 30 tablet 0   methocarbamol (ROBAXIN) 500 MG tablet Take 500 mg by mouth 2 (two) times daily.     NEURONTIN 300 MG capsule Take 300 mg by mouth 2 (two) times daily.     No current facility-administered medications for this visit.    REVIEW OF SYSTEMS:  [X]  denotes positive finding, [ ]  denotes negative finding Cardiac  Comments:  Chest pain or chest pressure:    Shortness of breath upon exertion:    Short of breath when lying flat:    Irregular heart rhythm:  Vascular    Pain in calf, thigh, or hip brought on by ambulation:    Pain in feet at night that wakes you up from your sleep:     Blood clot in your veins:    Leg swelling:         Pulmonary    Oxygen at home:    Productive cough:     Wheezing:         Neurologic    Sudden weakness in arms or legs:     Sudden numbness in arms or legs:     Sudden onset of difficulty speaking or slurred speech:    Temporary loss of vision in one eye:     Problems with dizziness:         Gastrointestinal    Blood in stool:     Vomited blood:         Genitourinary    Burning when urinating:     Blood in urine:        Psychiatric    Major depression:         Hematologic     Bleeding problems:    Problems with blood clotting too easily:        Skin    Rashes or ulcers:        Constitutional    Fever or chills:      PHYSICAL EXAM Vitals:   10/03/21 1514  BP: 131/84  Pulse: 87  Resp: 20  SpO2: 97%  Weight: 192 lb (87.1 kg)  Height: 6\' 1"  (1.854 m)    Constitutional: well appearing. no distress. Appears well nourished.  Neurologic: CN intact. no focal findings. no sensory loss. Psychiatric:  Mood and affect symmetric and appropriate. Eyes:  No icterus. No conjunctival pallor. Ears, nose, throat:  mucous membranes moist. Midline trachea.  Cardiac: regular rate and rhythm.  Respiratory:  unlabored. Abdominal:  soft, non-tender, non-distended.  Extremity: no edema. no cyanosis. no pallor.  Skin: no gangrene. No ulceration.  Lymphatic: no Stemmer's sign. no palpable lymphadenopathy.  PERTINENT LABORATORY AND RADIOLOGIC DATA  Most recent CBC CBC Latest Ref Rng & Units 10/16/2011 08/08/2007 05/19/2007  WBC 4.0 - 10.5 K/uL 11.7(H) 6.7 20.6(H)  Hemoglobin 13.0 - 17.0 g/dL 13.3 12.2(L) 13.3  Hematocrit 39.0 - 52.0 % 40.6 36.5(L) 39.9  Platelets 150 - 400 K/uL 176 178 SPECIMEN CHECKED FOR CLOTS PLATELET COUNT CONFIRMED BY SMEAR 206     Most recent CMP CMP 08/08/2007  Glucose 95  BUN 9  Creatinine 1.13  Sodium 139  Potassium 3.8  Chloride 101  CO2 27  Calcium 8.7   MRI of lumbar spine personally reviewed. No aberrant vascular anatomy. L5/S1 disc space appears safe to approach from an anterior perspective.   Yevonne Aline. Stanford Breed, MD [facility] Phone Number: [phone] 10/03/2021 4:33 PM  Total time spent on preparing this encounter including chart review, data review, collecting history, examining the patient, coordinating care for this new patient, 60 minutes.  Portions of this report may have been transcribed using voice recognition software.  Every effort has been made to ensure accuracy; however,  inadvertent computerized transcription errors may still be present.

## 2021-10-06 NOTE — Progress Notes (Signed)
Surgical Instructions    Your procedure is scheduled on Wednesday February 1st.  Report to [facility] Main Entrance "A" at 5:30 A.M., then check in with the Admitting office.  Call this number if you have problems the morning of surgery:  [phone]   If you have any questions prior to your surgery date call [phone]: Open Monday-Friday 8am-4pm    Remember:  Do not eat after midnight the night before your surgery  You may drink clear liquids until 5:30am the morning of your surgery.   Clear liquids allowed are: Water, Non-Citrus Juices (without pulp), Carbonated Beverages, Clear Tea, Black Coffee ONLY (NO MILK, CREAM OR POWDERED CREAMER of any kind), and Gatorade    Take these medicines the morning of surgery with A SIP OF WATER methocarbamol (ROBAXIN) 500 MG tablet NEURONTIN 300 MG capsule   As of today, STOP taking any Aspirin (unless otherwise instructed by your surgeon) Aleve, Naproxen, Ibuprofen, Motrin, Advil, Goody's, BC's, all herbal medications, fish oil, and all vitamins.  After your COVID test   You are not required to quarantine however you are required to wear a well-fitting mask when you are out and around people not in your household.  If your mask becomes wet or soiled, replace with a new one.  Wash your hands often with soap and water for 20 seconds or clean your hands with an alcohol-based hand sanitizer that contains at least 60% alcohol.  Do not share personal items.  Notify your provider: if you are in close contact with someone who has COVID  or if you develop a fever of 100.4 or greater, sneezing, cough, sore throat, shortness of breath or body aches.           Do not wear jewelry  Do not wear lotions, powders, colognes, or deodorant. Do not shave 48 hours prior to surgery.  Men may shave face and neck. Do not bring valuables to the hospital. DO Not wear nail polish, gel polish, artificial nails, or any other type of covering on natural nails  (fingers and toes) If you have artificial nails or gel coating that need to be removed by a nail salon, please have this removed prior to surgery. Artificial nails or gel coating may interfere with anesthesia's ability to adequately monitor your vital signs.             [facility] is not responsible for any belongings or valuables.  Do NOT Smoke (Tobacco/Vaping)  24 hours prior to your procedure  If you use a CPAP at night, you may bring your mask for your overnight stay.   Contacts, glasses, hearing aids, dentures or partials may not be worn into surgery, please bring cases for these belongings   For patients admitted to the hospital, discharge time will be determined by your treatment team.   Patients discharged the day of surgery will not be allowed to drive home, and someone needs to stay with them for 24 hours.  NO VISITORS WILL BE ALLOWED IN PRE-OP WHERE PATIENTS ARE PREPPED FOR SURGERY.  ONLY 1 SUPPORT PERSON MAY BE PRESENT IN THE WAITING ROOM WHILE YOU ARE IN SURGERY.  IF YOU ARE TO BE ADMITTED, ONCE YOU ARE IN YOUR ROOM YOU WILL BE ALLOWED TWO (2) VISITORS. 1 (ONE) VISITOR MAY STAY OVERNIGHT BUT MUST ARRIVE TO THE ROOM BY 8pm.  Minor children may have two parents present. Special consideration for safety and communication needs will be reviewed on a case by case basis.  Special  instructions:    Oral Hygiene is also important to reduce your risk of infection.  Remember - BRUSH YOUR TEETH THE MORNING OF SURGERY WITH YOUR REGULAR TOOTHPASTE   [facility]- Preparing For Surgery  Before surgery, you can play an important role. Because skin is not sterile, your skin needs to be as free of germs as possible. You can reduce the number of germs on your skin by washing with CHG (chlorahexidine gluconate) Soap before surgery.  CHG is an antiseptic cleaner which kills germs and bonds with the skin to continue killing germs even after washing.     Please do not use if you have an allergy to  CHG or antibacterial soaps. If your skin becomes reddened/irritated stop using the CHG.  Do not shave (including legs and underarms) for at least 48 hours prior to first CHG shower. It is OK to shave your face.  Please follow these instructions carefully.     Shower the NIGHT BEFORE SURGERY and the MORNING OF SURGERY with CHG Soap.   If you chose to wash your hair, wash your hair first as usual with your normal shampoo. After you shampoo, rinse your hair and body thoroughly to remove the shampoo.  Then [employer] and genitals (private parts) with your normal soap and rinse thoroughly to remove soap.  After that Use CHG Soap as you would any other liquid soap. You can apply CHG directly to the skin and wash gently with a scrungie or a clean washcloth.   Apply the CHG Soap to your body ONLY FROM THE NECK DOWN.  Do not use on open wounds or open sores. Avoid contact with your eyes, ears, mouth and genitals (private parts). Wash Face and genitals (private parts)  with your normal soap.   Wash thoroughly, paying special attention to the area where your surgery will be performed.  Thoroughly rinse your body with warm water from the neck down.  DO NOT shower/wash with your normal soap after using and rinsing off the CHG Soap.  Pat yourself dry with a CLEAN TOWEL.  Wear CLEAN PAJAMAS to bed the night before surgery  Place CLEAN SHEETS on your bed the night before your surgery  DO NOT SLEEP WITH PETS.   Day of Surgery:  Take a shower with CHG soap. Wear Clean/Comfortable clothing the morning of surgery Do not apply any deodorants/lotions.   Remember to brush your teeth WITH YOUR REGULAR TOOTHPASTE.   Please read over the following fact sheets that you were given.

## 2021-10-09 ENCOUNTER — Other Ambulatory Visit: Payer: Self-pay

## 2021-10-09 ENCOUNTER — Encounter (HOSPITAL_COMMUNITY): Payer: Self-pay

## 2021-10-09 ENCOUNTER — Encounter (HOSPITAL_COMMUNITY)
Admission: RE | Admit: 2021-10-09 | Discharge: 2021-10-09 | Disposition: A | Discharge status: Home / Self Care | Payer: No Typology Code available for payment source | Source: Ambulatory Visit | Attending: Orthopedic Surgery | Admitting: Orthopedic Surgery

## 2021-10-09 VITALS — BP 146/92 | HR 73 | Temp 98.3°F | Resp 18 | Ht 74.0 in | Wt 182.7 lb

## 2021-10-09 DIAGNOSIS — Z01818 Encounter for other preprocedural examination: Secondary | ICD-10-CM | POA: Insufficient documentation

## 2021-10-09 DIAGNOSIS — Z20822 Contact with and (suspected) exposure to covid-19: Secondary | ICD-10-CM | POA: Insufficient documentation

## 2021-10-09 DIAGNOSIS — M48061 Spinal stenosis, lumbar region without neurogenic claudication: Secondary | ICD-10-CM | POA: Insufficient documentation

## 2021-10-09 DIAGNOSIS — I252 Old myocardial infarction: Secondary | ICD-10-CM | POA: Insufficient documentation

## 2021-10-09 HISTORY — DX: Gastro-esophageal reflux disease without esophagitis: K21.9

## 2021-10-09 HISTORY — DX: Other specified postprocedural states: R11.2

## 2021-10-09 HISTORY — DX: Nausea with vomiting, unspecified: Z98.890

## 2021-10-09 HISTORY — DX: Other complications of anesthesia, initial encounter: T88.59XA

## 2021-10-09 HISTORY — DX: Pneumonia, unspecified organism: J18.9

## 2021-10-09 LAB — BASIC METABOLIC PANEL
Anion gap: 8 (ref 5–15)
BUN: 11 mg/dL (ref 6–20)
CO2: 28 mmol/L (ref 22–32)
Calcium: 9.5 mg/dL (ref 8.9–10.3)
Chloride: 102 mmol/L (ref 98–111)
Creatinine, Ser: 1.32 mg/dL — ABNORMAL HIGH (ref 0.61–1.24)
GFR, Estimated: >60 mL/min (ref >60)
Glucose, Bld: 102 mg/dL — ABNORMAL HIGH (ref 70–99)
Potassium: 3.8 mmol/L (ref 3.5–5.1)
Sodium: 138 mmol/L (ref 135–145)

## 2021-10-09 LAB — TYPE AND SCREEN
ABO/RH(D): A POS
Antibody Screen: NEGATIVE

## 2021-10-09 LAB — CBC
HCT: 43.7 % (ref 39.0–52.0)
Hemoglobin: 13.9 g/dL (ref 13.0–17.0)
MCH: 27.1 pg (ref 26.0–34.0)
MCHC: 31.8 g/dL (ref 30.0–36.0)
MCV: 85.2 fL (ref 80.0–100.0)
Platelets: 203 10*3/uL (ref 150–400)
RBC: 5.13 MIL/uL (ref 4.22–5.81)
RDW: 12.6 % (ref 11.5–15.5)
WBC: 5.3 10*3/uL (ref 4.0–10.5)
nRBC: 0 % (ref 0.0–0.2)

## 2021-10-09 LAB — SURGICAL PCR SCREEN
MRSA, PCR: NEGATIVE
Staphylococcus aureus: NEGATIVE

## 2021-10-09 NOTE — Progress Notes (Signed)
PCP - None Cardiologist - Denies. Ritta Slot MD performed cardiac cath at [facility] in 09/08/07  Chest x-ray - Not indicated EKG - 10/09/21 Stress Test - 20 years ago did see cardiologist then but was released from his care ECHO - Denies Cardiac Cath - 09/08/2007  Sleep Study - Denies  DM - Denies  ERAS Protcol - Yes  PRE-SURGERY Ensure given  COVID TEST- 10/09/21   Anesthesia review: Yes cardiac history  Patient denies shortness of breath, fever, cough and chest pain at PAT appointment   All instructions explained to the patient, with a verbal understanding of the material. Patient agrees to go over the instructions while at home for a better understanding. Patient also instructed to wear a mask while in public after being tested for COVID-19. The opportunity to ask questions was provided.

## 2021-10-10 ENCOUNTER — Other Ambulatory Visit: Payer: Self-pay

## 2021-10-10 ENCOUNTER — Other Ambulatory Visit: Payer: Self-pay | Admitting: Vascular Surgery

## 2021-10-10 DIAGNOSIS — M549 Dorsalgia, unspecified: Secondary | ICD-10-CM

## 2021-10-10 LAB — SARS CORONAVIRUS 2 (TAT 6-24 HRS): SARS Coronavirus 2: NEGATIVE

## 2021-10-10 NOTE — Anesthesia Preprocedure Evaluation (Addendum)
Anesthesia Evaluation  Patient identified by MRN, date of birth, ID band Patient awake    Reviewed: Allergy & Precautions, NPO status , Patient's Chart, lab work & pertinent test results  Airway Mallampati: III  TM Distance: >3 FB Neck ROM: Full    Dental  (+) Edentulous Upper, Missing   Pulmonary Patient abstained from smoking., former smoker,    Pulmonary exam normal breath sounds clear to auscultation       Cardiovascular Normal cardiovascular exam Rhythm:Regular Rate:Normal     Neuro/Psych  Headaches, Blind right eye negative psych ROS   GI/Hepatic Neg liver ROS, GERD  ,  Endo/Other  negative endocrine ROS  Renal/GU negative Renal ROS     Musculoskeletal negative musculoskeletal ROS (+)   Abdominal   Peds  Hematology negative hematology ROS (+)   Anesthesia Other Findings 10 month history of ongoing left leg pain, very likely secondary to the patient's 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 Location    Reproductive/Obstetrics                           Anesthesia Physical Anesthesia Plan  ASA: 2  Anesthesia Plan: General   Post-op Pain Management:    Induction: Intravenous  PONV Risk Score and Plan: 2 and Ondansetron, Dexamethasone, Midazolam and Treatment may vary due to age or medical condition  Airway Management Planned: Oral ETT  Additional Equipment: Arterial line  Intra-op Plan:   Post-operative Plan: Extubation in OR  Informed Consent: I have reviewed the patients History and Physical, chart, labs and discussed the procedure including the risks, benefits and alternatives for the proposed anesthesia with the patient or authorized representative who has indicated his/her understanding and acceptance.     Dental advisory given  Plan Discussed with: CRNA  Anesthesia Plan Comments: (PAT note by Antionette Poles, PA-C: MI is listed in patient history.  However, upon review of  records in epic, it appears patient had an abnormal stress test in 2008 which was followed up by a cath that showed essentially normal coronary arteries and normal left ventricular function.  Stress test felt to be false positive.  No further cardiac issues noted.  EKG 10/10/2021 shows normal sinus rhythm, rate 69.  Preop labs reviewed, creatinine mildly elevated at 1.32, otherwise unremarkable.   Cath 09/08/2007: FINDINGS: 1. Pressures. The AO pressure was 96/64/6/80. LV pressure was 96/11/08/09. The PBV was 96/6/12. The PBA was 97/65/82. No gradient was found across the aortic valve by pullback technique. 2. Coronary arteries: The right coronary artery was found to be small but long and dominant in supplying the PDA. There is no significant disease in the right coronary artery. The left main coronary artery was large and bifurcated into the left circumflex and a left anterior descending artery. The left main was free of any disease. The LAD artery was long, wrapped around the apex and was free of any disease. It gave rise to a large diagonal. It was also free of any disease. The left circumflex system was large and also codominant. It gave rise to a large OM branch. Neither of these vessels had any significant coronary artery disease in them. 3. Left ventriculography demonstrated a normal ejection fraction with EF of 65%. Mitral regurgitation was noted and no wall motion abnormality was seen.  IMPRESSION: A 51 year old gentleman with essentially normal coronary arteries and normal left ventricular function.  PLAN: He can go home later today. I will follow up with him in the  office in 1 week. )      Anesthesia Quick Evaluation

## 2021-10-10 NOTE — Progress Notes (Signed)
Anesthesia Chart Review:  MI is listed in patient history.  However, upon review of records in epic, it appears patient had an abnormal stress test in 2008 which was followed up by a cath that showed essentially normal coronary arteries and normal left ventricular function.  Stress test felt to be false positive.  No further cardiac issues noted.  EKG 10/10/2021 shows normal sinus rhythm, rate 69.  Preop labs reviewed, creatinine mildly elevated at 1.32, otherwise unremarkable.   Cath 09/08/2007:  FINDINGS:  1. Pressures.  The AO pressure was 96/64/6/80.  LV pressure was      96/11/08/09.  The PBV was 96/6/12.  The PBA was 97/65/82.  No      gradient was found across the aortic valve by pullback technique.  2. Coronary arteries:  The right coronary artery was found to be small      but long and dominant in supplying the PDA.  There is no      significant disease in the right coronary artery.  The left main      coronary artery was large and bifurcated into the left circumflex      and a left anterior descending artery.  The left main was free of      any disease.  The LAD artery was long, wrapped around the apex and      was free of any disease.  It gave rise to a large diagonal.  It was      also free of any disease.  The left circumflex system was large and      also codominant.  It gave rise to a large OM branch.  Neither of      these vessels had any significant coronary artery disease in them.  3. Left ventriculography demonstrated a normal ejection fraction with      EF of 65%.  Mitral regurgitation was noted and no wall motion      abnormality was seen.    IMPRESSION:  A 51 year old gentleman with essentially normal coronary  arteries and normal left ventricular function.    PLAN:  He can go home later today.  I will follow up with him in the  office in 1 week.   Zannie Cove [facility] Short Stay Center/Anesthesiology Phone [phone] 10/10/2021 9:50 AM

## 2021-10-11 ENCOUNTER — Inpatient Hospital Stay: Payer: Self-pay

## 2021-10-11 ENCOUNTER — Inpatient Hospital Stay (HOSPITAL_COMMUNITY): Payer: No Typology Code available for payment source

## 2021-10-11 ENCOUNTER — Encounter (HOSPITAL_COMMUNITY): Payer: Self-pay | Admitting: Orthopedic Surgery

## 2021-10-11 ENCOUNTER — Other Ambulatory Visit: Payer: Self-pay

## 2021-10-11 ENCOUNTER — Inpatient Hospital Stay (HOSPITAL_COMMUNITY)
Admission: RE | Disposition: A | Discharge status: Home / Self Care | Payer: Self-pay | Source: Ambulatory Visit | Attending: Orthopedic Surgery

## 2021-10-11 ENCOUNTER — Inpatient Hospital Stay (HOSPITAL_COMMUNITY)
Admission: RE | Admit: 2021-10-11 | Discharge: 2021-10-12 | DRG: 455 | Disposition: A | Discharge status: Home / Self Care | Payer: No Typology Code available for payment source | Attending: Orthopedic Surgery | Admitting: Orthopedic Surgery

## 2021-10-11 ENCOUNTER — Inpatient Hospital Stay (HOSPITAL_COMMUNITY): Payer: No Typology Code available for payment source | Admitting: Anesthesiology

## 2021-10-11 ENCOUNTER — Inpatient Hospital Stay (HOSPITAL_COMMUNITY): Payer: No Typology Code available for payment source | Admitting: Physician Assistant

## 2021-10-11 DIAGNOSIS — M4807 Spinal stenosis, lumbosacral region: Secondary | ICD-10-CM | POA: Diagnosis present

## 2021-10-11 DIAGNOSIS — M5417 Radiculopathy, lumbosacral region: Secondary | ICD-10-CM | POA: Diagnosis not present

## 2021-10-11 DIAGNOSIS — M4317 Spondylolisthesis, lumbosacral region: Secondary | ICD-10-CM | POA: Diagnosis present

## 2021-10-11 DIAGNOSIS — M5416 Radiculopathy, lumbar region: Secondary | ICD-10-CM | POA: Diagnosis present

## 2021-10-11 DIAGNOSIS — Z8701 Personal history of pneumonia (recurrent): Secondary | ICD-10-CM

## 2021-10-11 DIAGNOSIS — Z888 Allergy status to other drugs, medicaments and biological substances status: Secondary | ICD-10-CM | POA: Diagnosis not present

## 2021-10-11 DIAGNOSIS — Z87891 Personal history of nicotine dependence: Secondary | ICD-10-CM

## 2021-10-11 DIAGNOSIS — M5410 Radiculopathy, site unspecified: Secondary | ICD-10-CM

## 2021-10-11 DIAGNOSIS — X58XXXA Exposure to other specified factors, initial encounter: Secondary | ICD-10-CM | POA: Diagnosis present

## 2021-10-11 DIAGNOSIS — Z20822 Contact with and (suspected) exposure to covid-19: Secondary | ICD-10-CM | POA: Diagnosis present

## 2021-10-11 DIAGNOSIS — Z419 Encounter for procedure for purposes other than remedying health state, unspecified: Secondary | ICD-10-CM

## 2021-10-11 DIAGNOSIS — K08409 Partial loss of teeth, unspecified cause, unspecified class: Secondary | ICD-10-CM | POA: Diagnosis present

## 2021-10-11 DIAGNOSIS — Y990 Civilian activity done for income or pay: Secondary | ICD-10-CM

## 2021-10-11 DIAGNOSIS — M532X7 Spinal instabilities, lumbosacral region: Secondary | ICD-10-CM | POA: Diagnosis present

## 2021-10-11 DIAGNOSIS — M5137 Other intervertebral disc degeneration, lumbosacral region: Secondary | ICD-10-CM | POA: Diagnosis present

## 2021-10-11 HISTORY — DX: Radiculopathy, site unspecified: M54.10

## 2021-10-11 HISTORY — PX: ABDOMINAL EXPOSURE: SHX5708

## 2021-10-11 HISTORY — PX: ANTERIOR LAT LUMBAR FUSION: SHX1168

## 2021-10-11 SURGERY — ANTERIOR LATERAL LUMBAR FUSION 1 LEVEL
Anesthesia: General

## 2021-10-11 MED ORDER — SODIUM CHLORIDE 0.9% FLUSH: 3.0000 mL | Freq: Two times a day (BID) | INTRAVENOUS | Status: DC

## 2021-10-11 MED ORDER — DEXMEDETOMIDINE (PRECEDEX) IN NS 20 MCG/5ML (4 MCG/ML) IV SYRINGE
PREFILLED_SYRINGE | INTRAVENOUS | Status: DC | PRN
  Administered 2021-10-11: 4 ug via INTRAVENOUS
  Administered 2021-10-11 (×2): 8 ug via INTRAVENOUS

## 2021-10-11 MED ORDER — BUPIVACAINE-EPINEPHRINE 0.25% -1:200000 IJ SOLN
INTRAMUSCULAR | Status: DC | PRN
  Administered 2021-10-11: 10 mL
  Administered 2021-10-11: 20 mL

## 2021-10-11 MED ORDER — ACETAMINOPHEN 10 MG/ML IV SOLN
1000.0000 mg | Freq: Once | INTRAVENOUS | Status: DC | PRN
  Administered 2021-10-11: 1000 mg via INTRAVENOUS

## 2021-10-11 MED ORDER — CEFAZOLIN SODIUM-DEXTROSE 2-4 GM/100ML-% IV SOLN
2.0000 g | Freq: Three times a day (TID) | INTRAVENOUS | Status: AC
  Administered 2021-10-11 (×2): 2 g via INTRAVENOUS
  Filled 2021-10-11 (×2): qty 100

## 2021-10-11 MED ORDER — DEXMEDETOMIDINE (PRECEDEX) IN NS 20 MCG/5ML (4 MCG/ML) IV SYRINGE
PREFILLED_SYRINGE | INTRAVENOUS | Status: AC
  Filled 2021-10-11: qty 5

## 2021-10-11 MED ORDER — BUPIVACAINE LIPOSOME 1.3 % IJ SUSP
INTRAMUSCULAR | Status: DC | PRN
Start: 2021-10-11 — End: 2021-10-11
  Administered 2021-10-11: 20 mL

## 2021-10-11 MED ORDER — PROMETHAZINE HCL 25 MG/ML IJ SOLN
6.2500 mg | INTRAMUSCULAR | Status: DC | PRN
  Administered 2021-10-11: 12.5 mg via INTRAVENOUS

## 2021-10-11 MED ORDER — MIDAZOLAM HCL 2 MG/2ML IJ SOLN
INTRAMUSCULAR | Status: DC | PRN
  Administered 2021-10-11: 2 mg via INTRAVENOUS

## 2021-10-11 MED ORDER — HYDROCODONE-ACETAMINOPHEN 5-325 MG PO TABS: 1.0000 | ORAL_TABLET | ORAL | Status: DC | PRN

## 2021-10-11 MED ORDER — 0.9 % SODIUM CHLORIDE (POUR BTL) OPTIME
TOPICAL | Status: DC | PRN
  Administered 2021-10-11: 1000 mL

## 2021-10-11 MED ORDER — HEPARIN 6000 UNIT IRRIGATION SOLUTION
Status: AC
  Filled 2021-10-11: qty 500

## 2021-10-11 MED ORDER — LACTATED RINGERS IV SOLN: INTRAVENOUS | Status: DC | PRN

## 2021-10-11 MED ORDER — CHLORHEXIDINE GLUCONATE CLOTH 2 % EX PADS: 6.0000 | MEDICATED_PAD | Freq: Once | CUTANEOUS | Status: DC

## 2021-10-11 MED ORDER — SODIUM CHLORIDE 0.9 % IV SOLN: 250.0000 mL | INTRAVENOUS | Status: DC

## 2021-10-11 MED ORDER — ROCURONIUM BROMIDE 10 MG/ML (PF) SYRINGE
PREFILLED_SYRINGE | INTRAVENOUS | Status: DC | PRN
Start: 2021-10-11 — End: 2021-10-11
  Administered 2021-10-11: 40 mg via INTRAVENOUS
  Administered 2021-10-11: 50 mg via INTRAVENOUS
  Administered 2021-10-11: 60 mg via INTRAVENOUS

## 2021-10-11 MED ORDER — ROCURONIUM BROMIDE 10 MG/ML (PF) SYRINGE
PREFILLED_SYRINGE | INTRAVENOUS | Status: AC
  Filled 2021-10-11: qty 10

## 2021-10-11 MED ORDER — PROPOFOL 10 MG/ML IV BOLUS
INTRAVENOUS | Status: AC
  Filled 2021-10-11: qty 20

## 2021-10-11 MED ORDER — CEFAZOLIN SODIUM-DEXTROSE 2-4 GM/100ML-% IV SOLN
2.0000 g | INTRAVENOUS | Status: AC
  Administered 2021-10-11 (×2): 2 g via INTRAVENOUS

## 2021-10-11 MED ORDER — FENTANYL CITRATE (PF) 250 MCG/5ML IJ SOLN
INTRAMUSCULAR | Status: DC | PRN
  Administered 2021-10-11 (×2): 50 ug via INTRAVENOUS
  Administered 2021-10-11: 150 ug via INTRAVENOUS

## 2021-10-11 MED ORDER — FENTANYL CITRATE (PF) 100 MCG/2ML IJ SOLN
INTRAMUSCULAR | Status: AC
  Filled 2021-10-11: qty 2

## 2021-10-11 MED ORDER — OXYCODONE-ACETAMINOPHEN 5-325 MG PO TABS
1.0000 | ORAL_TABLET | ORAL | Status: DC | PRN
  Administered 2021-10-11 – 2021-10-12 (×3): 2 via ORAL
  Filled 2021-10-11 (×4): qty 2

## 2021-10-11 MED ORDER — DOCUSATE SODIUM 100 MG PO CAPS
100.0000 mg | ORAL_CAPSULE | Freq: Two times a day (BID) | ORAL | Status: DC
  Administered 2021-10-11 – 2021-10-12 (×2): 100 mg via ORAL
  Filled 2021-10-11 (×2): qty 1

## 2021-10-11 MED ORDER — METHOCARBAMOL 500 MG PO TABS
500.0000 mg | ORAL_TABLET | Freq: Four times a day (QID) | ORAL | Status: DC | PRN
  Administered 2021-10-11 (×2): 500 mg via ORAL
  Filled 2021-10-11 (×3): qty 1

## 2021-10-11 MED ORDER — MIDAZOLAM HCL 2 MG/2ML IJ SOLN
INTRAMUSCULAR | Status: AC
  Filled 2021-10-11: qty 2

## 2021-10-11 MED ORDER — CHLORHEXIDINE GLUCONATE 0.12 % MT SOLN
15.0000 mL | Freq: Once | OROMUCOSAL | Status: AC
  Administered 2021-10-11: 15 mL via OROMUCOSAL

## 2021-10-11 MED ORDER — PHENOL 1.4 % MT LIQD: 1.0000 | OROMUCOSAL | Status: DC | PRN

## 2021-10-11 MED ORDER — ACETAMINOPHEN 10 MG/ML IV SOLN
INTRAVENOUS | Status: AC
  Filled 2021-10-11: qty 100

## 2021-10-11 MED ORDER — SORBITOL 70 % SOLN
30.0000 mL | Freq: Every day | Status: DC | PRN
  Administered 2021-10-11: 30 mL via ORAL
  Filled 2021-10-11: qty 30

## 2021-10-11 MED ORDER — PHENYLEPHRINE 40 MCG/ML (10ML) SYRINGE FOR IV PUSH (FOR BLOOD PRESSURE SUPPORT)
PREFILLED_SYRINGE | INTRAVENOUS | Status: AC
  Filled 2021-10-11: qty 10

## 2021-10-11 MED ORDER — ZOLPIDEM TARTRATE 5 MG PO TABS: 5.0000 mg | ORAL_TABLET | Freq: Every evening | ORAL | Status: DC | PRN

## 2021-10-11 MED ORDER — ACETAMINOPHEN 325 MG PO TABS: 650.0000 mg | ORAL_TABLET | ORAL | Status: DC | PRN

## 2021-10-11 MED ORDER — LIDOCAINE 2% (20 MG/ML) 5 ML SYRINGE
INTRAMUSCULAR | Status: AC
  Filled 2021-10-11: qty 5

## 2021-10-11 MED ORDER — FENTANYL CITRATE (PF) 250 MCG/5ML IJ SOLN
INTRAMUSCULAR | Status: AC
  Filled 2021-10-11: qty 5

## 2021-10-11 MED ORDER — METHOCARBAMOL 1000 MG/10ML IJ SOLN
500.0000 mg | Freq: Four times a day (QID) | INTRAVENOUS | Status: DC | PRN
  Filled 2021-10-11: qty 5

## 2021-10-11 MED ORDER — THROMBIN 20000 UNITS EX SOLR
CUTANEOUS | Status: DC | PRN
  Administered 2021-10-11: 20 mL via TOPICAL

## 2021-10-11 MED ORDER — SENNOSIDES-DOCUSATE SODIUM 8.6-50 MG PO TABS: 1.0000 | ORAL_TABLET | Freq: Every evening | ORAL | Status: DC | PRN

## 2021-10-11 MED ORDER — AMISULPRIDE (ANTIEMETIC) 5 MG/2ML IV SOLN: 10.0000 mg | Freq: Once | INTRAVENOUS | Status: DC | PRN

## 2021-10-11 MED ORDER — PROPOFOL 10 MG/ML IV BOLUS
INTRAVENOUS | Status: DC | PRN
Start: 2021-10-11 — End: 2021-10-11
  Administered 2021-10-11: 200 mg via INTRAVENOUS

## 2021-10-11 MED ORDER — ACETAMINOPHEN 650 MG RE SUPP: 650.0000 mg | RECTAL | Status: DC | PRN

## 2021-10-11 MED ORDER — POTASSIUM CHLORIDE IN NACL 20-0.9 MEQ/L-% IV SOLN: INTRAVENOUS | Status: DC

## 2021-10-11 MED ORDER — PROMETHAZINE HCL 25 MG/ML IJ SOLN
INTRAMUSCULAR | Status: AC
  Filled 2021-10-11: qty 1

## 2021-10-11 MED ORDER — BUPIVACAINE LIPOSOME 1.3 % IJ SUSP
INTRAMUSCULAR | Status: AC
  Filled 2021-10-11: qty 20

## 2021-10-11 MED ORDER — SCOPOLAMINE 1 MG/3DAYS TD PT72
MEDICATED_PATCH | TRANSDERMAL | Status: AC
  Filled 2021-10-11: qty 1

## 2021-10-11 MED ORDER — SCOPOLAMINE 1 MG/3DAYS TD PT72
MEDICATED_PATCH | TRANSDERMAL | Status: DC | PRN
  Administered 2021-10-11: 1 via TRANSDERMAL

## 2021-10-11 MED ORDER — ONDANSETRON HCL 4 MG PO TABS: 4.0000 mg | ORAL_TABLET | Freq: Four times a day (QID) | ORAL | Status: DC | PRN

## 2021-10-11 MED ORDER — EPHEDRINE 5 MG/ML INJ
INTRAVENOUS | Status: AC
  Filled 2021-10-11: qty 5

## 2021-10-11 MED ORDER — MORPHINE SULFATE (PF) 2 MG/ML IV SOLN: 1.0000 mg | INTRAVENOUS | Status: DC | PRN

## 2021-10-11 MED ORDER — FENTANYL CITRATE (PF) 100 MCG/2ML IJ SOLN
25.0000 ug | INTRAMUSCULAR | Status: DC | PRN
  Administered 2021-10-11 (×3): 50 ug via INTRAVENOUS

## 2021-10-11 MED ORDER — LACTATED RINGERS IV SOLN: INTRAVENOUS | Status: DC

## 2021-10-11 MED ORDER — SODIUM CHLORIDE 0.9% FLUSH: 3.0000 mL | INTRAVENOUS | Status: DC | PRN

## 2021-10-11 MED ORDER — ONDANSETRON HCL 4 MG/2ML IJ SOLN: 4.0000 mg | Freq: Four times a day (QID) | INTRAMUSCULAR | Status: DC | PRN

## 2021-10-11 MED ORDER — ORAL CARE MOUTH RINSE: 15.0000 mL | Freq: Once | OROMUCOSAL | Status: AC

## 2021-10-11 MED ORDER — BUPIVACAINE-EPINEPHRINE (PF) 0.25% -1:200000 IJ SOLN
INTRAMUSCULAR | Status: AC
  Filled 2021-10-11: qty 30

## 2021-10-11 MED ORDER — PHENYLEPHRINE HCL-NACL 20-0.9 MG/250ML-% IV SOLN
INTRAVENOUS | Status: DC | PRN
Start: 2021-10-11 — End: 2021-10-11
  Administered 2021-10-11: 100 ug/min via INTRAVENOUS

## 2021-10-11 MED ORDER — ONDANSETRON HCL 4 MG/2ML IJ SOLN
INTRAMUSCULAR | Status: AC
  Filled 2021-10-11: qty 2

## 2021-10-11 MED ORDER — SUGAMMADEX SODIUM 200 MG/2ML IV SOLN
INTRAVENOUS | Status: DC | PRN
Start: 2021-10-11 — End: 2021-10-11
  Administered 2021-10-11: 200 mg via INTRAVENOUS

## 2021-10-11 MED ORDER — FLEET ENEMA 7-19 GM/118ML RE ENEM: 1.0000 | ENEMA | Freq: Once | RECTAL | Status: DC | PRN

## 2021-10-11 MED ORDER — ALBUMIN HUMAN 5 % IV SOLN
INTRAVENOUS | Status: DC | PRN
Start: 2021-10-11 — End: 2021-10-11

## 2021-10-11 MED ORDER — ACETAMINOPHEN 500 MG PO TABS
1000.0000 mg | ORAL_TABLET | Freq: Once | ORAL | Status: AC
  Administered 2021-10-11: 1000 mg via ORAL

## 2021-10-11 MED ORDER — BISACODYL 5 MG PO TBEC: 5.0000 mg | DELAYED_RELEASE_TABLET | Freq: Every day | ORAL | Status: DC | PRN

## 2021-10-11 MED ORDER — OXYCODONE HCL 5 MG PO TABS: 5.0000 mg | ORAL_TABLET | Freq: Once | ORAL | Status: DC | PRN

## 2021-10-11 MED ORDER — POVIDONE-IODINE 7.5 % EX SOLN: Freq: Once | CUTANEOUS | Status: DC

## 2021-10-11 MED ORDER — ONDANSETRON HCL 4 MG/2ML IJ SOLN
INTRAMUSCULAR | Status: DC | PRN
  Administered 2021-10-11: 4 mg via INTRAVENOUS

## 2021-10-11 MED ORDER — THROMBIN 20000 UNITS EX KIT
PACK | CUTANEOUS | Status: AC
  Filled 2021-10-11: qty 1

## 2021-10-11 MED ORDER — DEXAMETHASONE SODIUM PHOSPHATE 10 MG/ML IJ SOLN
INTRAMUSCULAR | Status: AC
  Filled 2021-10-11: qty 1

## 2021-10-11 MED ORDER — ALUM & MAG HYDROXIDE-SIMETH 200-200-20 MG/5ML PO SUSP: 30.0000 mL | Freq: Four times a day (QID) | ORAL | Status: DC | PRN

## 2021-10-11 MED ORDER — LIDOCAINE 2% (20 MG/ML) 5 ML SYRINGE
INTRAMUSCULAR | Status: DC | PRN
  Administered 2021-10-11: 60 mg via INTRAVENOUS

## 2021-10-11 MED ORDER — MENTHOL 3 MG MT LOZG: 1.0000 | LOZENGE | OROMUCOSAL | Status: DC | PRN

## 2021-10-11 MED ORDER — OXYCODONE HCL 5 MG/5ML PO SOLN: 5.0000 mg | Freq: Once | ORAL | Status: DC | PRN

## 2021-10-11 SURGICAL SUPPLY — 97 items
AGENT HMST KT MTR STRL THRMB (HEMOSTASIS)
APL SKNCLS STERI-STRIP NONHPOA (GAUZE/BANDAGES/DRESSINGS) ×2
APPLIER CLIP 11 MED OPEN (CLIP) ×2
APR CLP MED 11 20 MLT OPN (CLIP) ×1
BAG COUNTER SPONGE SURGICOUNT (BAG) ×6 IMPLANT
BAG SPNG CNTER NS LX DISP (BAG) ×2
BENZOIN TINCTURE PRP APPL 2/3 (GAUZE/BANDAGES/DRESSINGS) ×2 IMPLANT
BLADE CLIPPER SURG (BLADE) IMPLANT
BLADE SURG 10 STRL SS (BLADE) ×3 IMPLANT
BONE VIVIGEN FORMABLE 10CC (Bone Implant) ×2 IMPLANT
CLIP APPLIE 11 MED OPEN (CLIP) ×2 IMPLANT
CLIP LIGATING EXTRA MED SLVR (CLIP) ×2 IMPLANT
CLSR STERI-STRIP ANTIMIC 1/2X4 (GAUZE/BANDAGES/DRESSINGS) ×1 IMPLANT
COVER BACK TABLE 80X110 HD (DRAPES) ×3 IMPLANT
COVER SURGICAL LIGHT HANDLE (MISCELLANEOUS) ×3 IMPLANT
DRAPE C-ARM 42X72 X-RAY (DRAPES) ×3 IMPLANT
DRAPE C-ARMOR (DRAPES) ×3 IMPLANT
DRAPE POUCH INSTRU U-SHP 10X18 (DRAPES) ×3 IMPLANT
DRAPE SURG 17X23 STRL (DRAPES) ×12 IMPLANT
DURAPREP 26ML APPLICATOR (WOUND CARE) ×3 IMPLANT
ELECT BLADE 4.0 EZ CLEAN MEGAD (MISCELLANEOUS) ×2
ELECT BLADE 6.5 EXT (BLADE) ×3 IMPLANT
ELECT CAUTERY BLADE 6.4 (BLADE) ×3 IMPLANT
ELECT REM PT RETURN 9FT ADLT (ELECTROSURGICAL) ×2
ELECTRODE BLDE 4.0 EZ CLN MEGD (MISCELLANEOUS) ×2 IMPLANT
ELECTRODE REM PT RTRN 9FT ADLT (ELECTROSURGICAL) ×2 IMPLANT
GAUZE 4X4 16PLY ~~LOC~~+RFID DBL (SPONGE) ×3 IMPLANT
GAUZE SPONGE 4X4 12PLY STRL (GAUZE/BANDAGES/DRESSINGS) ×2 IMPLANT
GLOVE SRG 8 PF TXTR STRL LF DI (GLOVE) ×2 IMPLANT
GLOVE SURG ENC MOIS LTX SZ6.5 (GLOVE) ×6 IMPLANT
GLOVE SURG ENC MOIS LTX SZ8 (GLOVE) ×3 IMPLANT
GLOVE SURG NONLX 8.0 ULT (GLOVE) ×3 IMPLANT
GLOVE SURG UNDER POLY LF SZ7 (GLOVE) ×3 IMPLANT
GLOVE SURG UNDER POLY LF SZ8 (GLOVE) ×2
GOWN STRL REUS W/ TWL LRG LVL3 (GOWN DISPOSABLE) ×2 IMPLANT
GOWN STRL REUS W/ TWL XL LVL3 (GOWN DISPOSABLE) ×6 IMPLANT
GOWN STRL REUS W/TWL LRG LVL3 (GOWN DISPOSABLE) ×2
GOWN STRL REUS W/TWL XL LVL3 (GOWN DISPOSABLE) ×6
GRAFT BNE MATRIX VG FRMBL L 10 (Bone Implant) IMPLANT
GUIDEWIRE SHARP VIPER II (WIRE) ×4 IMPLANT
INSERT FOGARTY 61MM (MISCELLANEOUS) IMPLANT
INSERT FOGARTY SM (MISCELLANEOUS) IMPLANT
IV CATH 14GX2 1/4 (CATHETERS) ×3 IMPLANT
KIT ALARA NEURO ACCESS (KITS) ×2 IMPLANT
KIT BASIN OR (CUSTOM PROCEDURE TRAY) ×3 IMPLANT
KIT TURNOVER KIT B (KITS) ×3 IMPLANT
MARKER SKIN DUAL TIP RULER LAB (MISCELLANEOUS) ×4 IMPLANT
NDL HYPO 25GX1X1/2 BEV (NEEDLE) ×2 IMPLANT
NDL SPNL 18GX3.5 QUINCKE PK (NEEDLE) ×2 IMPLANT
NEEDLE HYPO 25GX1X1/2 BEV (NEEDLE) ×2 IMPLANT
NEEDLE SPNL 18GX3.5 QUINCKE PK (NEEDLE) ×2 IMPLANT
NS IRRIG 1000ML POUR BTL (IV SOLUTION) ×6 IMPLANT
PACK LAMINECTOMY ORTHO (CUSTOM PROCEDURE TRAY) ×3 IMPLANT
PACK UNIVERSAL I (CUSTOM PROCEDURE TRAY) ×4 IMPLANT
PAD ARMBOARD 7.5X6 YLW CONV (MISCELLANEOUS) ×12 IMPLANT
PENCIL SMOKE EVACUATOR (MISCELLANEOUS) ×2 IMPLANT
ROD VIPER2 5.5X45 PRE LARDOSED (Rod) ×2 IMPLANT
SCREW POLY VIPER2 7X40MM (Screw) ×2 IMPLANT
SCREW SET SINGLE INNER MIS (Screw) ×4 IMPLANT
SCREW XTAB POLY VIPER  7X45 (Screw) ×4 IMPLANT
SCREW XTAB POLY VIPER 7X45 (Screw) IMPLANT
SPACER MAGNIFY 29X39X11 15D (Spacer) ×1 IMPLANT
SPONGE INTESTINAL PEANUT (DISPOSABLE) ×9 IMPLANT
SPONGE SURGIFOAM ABS GEL 100 (HEMOSTASIS) ×1 IMPLANT
SPONGE T-LAP 18X18 ~~LOC~~+RFID (SPONGE) ×3 IMPLANT
SPONGE T-LAP 4X18 ~~LOC~~+RFID (SPONGE) ×9 IMPLANT
STAPLER VISISTAT 35W (STAPLE) ×3 IMPLANT
STRIP CLOSURE SKIN 1/2X4 (GAUZE/BANDAGES/DRESSINGS) ×1 IMPLANT
SURGIFLO W/THROMBIN 8M KIT (HEMOSTASIS) IMPLANT
SUT MNCRL AB 4-0 PS2 18 (SUTURE) ×3 IMPLANT
SUT PDS AB 1 CT1 36 (SUTURE) ×2 IMPLANT
SUT PDS AB 1 CTX 36 (SUTURE) ×3 IMPLANT
SUT PDS AB 1 TP1 96 (SUTURE) IMPLANT
SUT PROLENE 4 0 RB 1 (SUTURE)
SUT PROLENE 4-0 RB1 .5 CRCL 36 (SUTURE) IMPLANT
SUT PROLENE 5 0 CC1 (SUTURE) IMPLANT
SUT PROLENE 6 0 BV (SUTURE) ×6 IMPLANT
SUT PROLENE 6 0 C 1 30 (SUTURE) ×3 IMPLANT
SUT SILK 0 TIES 10X30 (SUTURE) ×3 IMPLANT
SUT SILK 2 0 TIES 10X30 (SUTURE) ×3 IMPLANT
SUT SILK 2 0SH CR/8 30 (SUTURE) IMPLANT
SUT SILK 3 0 TIES 17X18 (SUTURE) ×2
SUT SILK 3 0SH CR/8 30 (SUTURE) IMPLANT
SUT SILK 3-0 18XBRD TIE BLK (SUTURE) ×2 IMPLANT
SUT VIC AB 0 CT1 18XCR BRD 8 (SUTURE) ×2 IMPLANT
SUT VIC AB 0 CT1 8-18 (SUTURE) ×2
SUT VIC AB 1 CT1 18XCR BRD 8 (SUTURE) ×2 IMPLANT
SUT VIC AB 1 CT1 8-18 (SUTURE) ×2
SUT VIC AB 2-0 CT2 18 VCP726D (SUTURE) ×3 IMPLANT
SYR BULB IRRIG 60ML STRL (SYRINGE) ×3 IMPLANT
SYR CONTROL 10ML LL (SYRINGE) ×3 IMPLANT
TAP CANN VIPER2 DL 6.0 (TAP) ×2 IMPLANT
TOWEL GREEN STERILE (TOWEL DISPOSABLE) ×6 IMPLANT
TOWEL GREEN STERILE FF (TOWEL DISPOSABLE) ×3 IMPLANT
TRAY FOLEY MTR SLVR 16FR STAT (SET/KITS/TRAYS/PACK) ×3 IMPLANT
WATER STERILE IRR 1000ML POUR (IV SOLUTION) ×3 IMPLANT
YANKAUER SUCT BULB TIP NO VENT (SUCTIONS) ×3 IMPLANT

## 2021-10-11 NOTE — Transfer of Care (Signed)
Immediate Anesthesia Transfer of Care Note  Patient: Noah Liu  Procedure(s) Performed: LUMBAR FIVE - SACRUM ONE ANTERIOR LUMBAR INTERBODY FUSION WITH POSTERIOR SPINAL FUSION WITH INSTRUMENTATION AND ALLOGRAFT ABDOMINAL EXPOSURE  Patient Location: PACU  Anesthesia Type:General  Level of Consciousness: awake, patient cooperative and responds to stimulation  Airway & Oxygen Therapy: Patient Spontanous Breathing and Patient connected to nasal cannula oxygen  Post-op Assessment: Report given to RN, Post -op Vital signs reviewed and stable and Patient moving all extremities X 4  Post vital signs: Reviewed and stable  Last Vitals:  Vitals Value Taken Time  BP 162/95 10/11/21 1313  Temp    Pulse 103 10/11/21 1315  Resp 26 10/11/21 1315  SpO2 100 % 10/11/21 1315  Vitals shown include unvalidated device data.  Last Pain:  Vitals:   10/11/21 0707  TempSrc:   PainSc: 0-No pain         Complications: No notable events documented.

## 2021-10-11 NOTE — Op Note (Signed)
DATE OF SERVICE: 10/11/2021  PATIENT:  Noah Liu  51 y.o. male  PRE-OPERATIVE DIAGNOSIS:  unstable and stenosed L5/S1   POST-OPERATIVE DIAGNOSIS:  Same  PROCEDURE:   anterior spinal exposure for L5/S1 anterior lumbar interbody fusion  SURGEON:  Surgeon(s) and Role: Panel 1:    Estill Bamberg, MD - Primary Panel 2:    * Leonie Douglas, MD - Primary  COSURGEON: Estill Bamberg, MD  An assistant was required to facilitate exposure and expedite the case.  ANESTHESIA:   general  EBL: minimal from my portion of case  BLOOD ADMINISTERED:none  DRAINS: none   LOCAL MEDICATIONS USED:  NONE  SPECIMEN:  none  COUNTS: confirmed correct.  TOURNIQUET:  none  PATIENT DISPOSITION:  PACU - hemodynamically stable.   Delay start of Pharmacological VTE agent (>24hrs) due to surgical blood loss or risk of bleeding: no  INDICATION FOR PROCEDURE: Noah Liu is a 51 y.o. male with back pain and radicular leg pain.  He saw Dr. 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 After careful discussion of risks, benefits, and alternatives the patient was offered anterior spinal exposure. The patient understood and wished to proceed.  OPERATIVE FINDINGS: Unremarkable exposure of the L5/S1 disc space.  DESCRIPTION OF PROCEDURE: After identification of the patient in the pre-operative holding area, the patient was transferred to the operating room. The patient was positioned supine on the operating room table. Anesthesia was induced. The abdomen was prepped and draped in standard fashion. A surgical pause was performed confirming correct patient, procedure, and operative location.  Using intraoperative fluoroscopy the skin overlying the L5/S1 disc space was marked.  A transverse incision was made and carried down through subtenons tissue until the anterior rectus fascia was identified.  This was incised longitudinally.  The rectus muscle  was mobilized completely.  I bluntly entered the retroperitoneal space in the lateral aspect of the wound and carried blunt dissection carefully to avoid injuring the peritoneal envelope.  I identified the psoas muscle, followed by the left iliac vessels.  I was able to manually palpate the L5/S1 disc base.  I then created a blunt plane over the disc base.  We introduced a [employer] self-retaining retractor system into the incision.  We continued to skeletonize the disc base.  The retractor system was deployed to completely expose the lateral aspects of the L5/S1 disc space.  An anterior retractor was used to hold the peritoneal contents above the disc base.  The inferior aspect of the wound did not require an additional retractor.  Dr. Yevette Edwards then introduced a needle into the L5/S1 disc base.  Intraoperative fluoroscopy was used to confirm correct exposure.  Once Dr. Yevette Edwards was satisfied, I turned the case over to him.  The remainder of the repair will be dictated in a separate note.  Rande Brunt. Lenell Antu, MD [facility] Phone Number: [phone] 10/11/2021 10:26 AM

## 2021-10-11 NOTE — H&P (Signed)
PREOPERATIVE H&P  Chief Complaint: Low back pain, left leg pain  HPI: Noah Liu is a 51 y.o. male who presents with ongoing pain in the back and left leg s/p a work injury that occurred on 10/16/2020.   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  Patient has failed multiple forms of conservative care and continues to have pain (see office notes for additional details regarding the patient's full course of treatment)  Past Medical History:  Diagnosis Date   Acute MI (HCC) 2007   Complication of anesthesia    GERD (gastroesophageal reflux disease)    Headache    Pneumonia    PONV (postoperative nausea and vomiting)    Reflux    Past Surgical History:  Procedure Laterality Date   CARDIAC CATHETERIZATION  09/08/2007   ESOPHAGOGASTRODUODENOSCOPY     EYE SURGERY     multiple   Social History   Socioeconomic History   Marital status: Married    Spouse name: Helmer Dull   Number of children: 5   Years of education: Not on file   Highest education level: Not on file  Occupational History   Not on file  Tobacco Use   Smoking status: Former    Types: Cigarettes    Quit date: 2016    Years since quitting: 7.0   Smokeless tobacco: Never  Vaping Use   Vaping Use: Some days  Substance and Sexual Activity   Alcohol use: No   Drug use: No   Sexual activity: Yes  Other Topics Concern   Not on file  Social History Narrative   Not on file   Social Determinants of Health   Financial Resource Strain: Not on file  Food Insecurity: Not on file  Transportation Needs: Not on file  Physical Activity: Not on file  Stress: Not on file  Social Connections: Not on file   Family History  Problem Relation Age of Onset   Hypertension Mother    Heart attack Father    Hypertension Father    Von Willebrand disease Father    Hypertension Sister    Allergies  Allergen Reactions   Aspirin Other (See Comments)    Upset stomach and  causes bleeding ulcers     Prior to Admission medications   Medication Sig Start Date End Date Taking? Authorizing Provider  ibuprofen (ADVIL,MOTRIN) 600 MG tablet Take 1 tablet (600 mg total) by mouth every 6 (six) hours as needed. Patient taking differently: Take 400 mg by mouth every 6 (six) hours as needed for mild pain or moderate pain. 01/13/17  Yes Horton, Mayer Masker, MD  methocarbamol (ROBAXIN) 500 MG tablet Take 500 mg by mouth 2 (two) times daily. 04/12/21  Yes [provider]  NEURONTIN 300 MG capsule Take 300 mg by mouth 2 (two) times daily. 09/08/21  Yes [provider]     All other systems have been reviewed and were otherwise negative with the exception of those mentioned in the HPI and as above.  Physical Exam: Vitals:   10/11/21 0640  BP: (!) 159/93  Pulse: 70  Resp: 17  Temp: 98.1 F (36.7 C)  SpO2: 100%    Body mass index is 23.37 kg/m.  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

## 2021-10-11 NOTE — Anesthesia Postprocedure Evaluation (Signed)
Anesthesia Post Note  Patient: Noah Liu  Procedure(s) Performed: LUMBAR FIVE - SACRUM ONE ANTERIOR LUMBAR INTERBODY FUSION WITH POSTERIOR SPINAL FUSION WITH INSTRUMENTATION AND ALLOGRAFT ABDOMINAL EXPOSURE     Patient location during evaluation: PACU Anesthesia Type: General Level of consciousness: awake Pain management: pain level controlled Vital Signs Assessment: post-procedure vital signs reviewed and stable Respiratory status: spontaneous breathing, nonlabored ventilation, respiratory function stable and patient connected to nasal cannula oxygen Cardiovascular status: blood pressure returned to baseline and stable Postop Assessment: no apparent nausea or vomiting Anesthetic complications: no   No notable events documented.  Last Vitals:  Vitals:   10/11/21 1425 10/11/21 1511  BP: 127/79 (!) 156/91  Pulse: 80 89  Resp: 13 18  Temp: 36.7 C 36.6 C  SpO2: 100% 100%    Last Pain:  Vitals:   10/11/21 1511  TempSrc:   PainSc: 3                  Takeysha Bonk P Reganne Messerschmidt

## 2021-10-11 NOTE — Progress Notes (Signed)
A-Line d/c tolerated procedure pressure held to area dry dressing in place

## 2021-10-11 NOTE — OR Nursing (Signed)
Per protocol Dr. Loni Dolly called on 10-11-2021 at 1124 and confirmed there was no instrumentation or foreign body retained. MV RN

## 2021-10-11 NOTE — Anesthesia Procedure Notes (Signed)
Arterial Line Insertion Start/End2/09/2021 8:06 AM, 10/11/2021 8:06 AM Performed by: Leonides Grills, MD, Colon Flattery, CRNA, CRNA  Patient location: Pre-op. Preanesthetic checklist: patient identified, IV checked, site marked, risks and benefits discussed, surgical consent, monitors and equipment checked, pre-op evaluation, timeout performed and anesthesia consent Lidocaine 1% used for infiltration Left, radial was placed Catheter size: 20 G Hand hygiene performed  and maximum sterile barriers used   Attempts: 1 Procedure performed without using ultrasound guided technique. Following insertion, dressing applied. Post procedure assessment: normal and unchanged  Additional procedure comments: Procedure tolerated well. Marland Kitchen

## 2021-10-11 NOTE — Interval H&P Note (Signed)
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  The various methods of treatment have been discussed with the patient and family. After consideration of risks, benefits and other options for treatment, the patient has consented to  Procedure(s): LUMBAR 5 - SACRUM 1 ANTERIOR LUMBAR INTERBODY FUSION WITH POSTERIOR SPINAL FUSION WITH INSTRUMENTATION AND ALLOGRAFT (N/A) ABDOMINAL EXPOSURE (N/A) as a surgical intervention.  The patient's history has been reviewed, patient examined, no change in status, stable for surgery.  I have reviewed the patient's chart and labs.  Questions were answered to the patient's satisfaction.     Leonie Douglas

## 2021-10-11 NOTE — Anesthesia Procedure Notes (Signed)
Procedure Name: Intubation Date/Time: 10/11/2021 8:38 AM Performed by: Michele Rockers, CRNA Pre-anesthesia Checklist: Patient identified, Patient being monitored, Timeout performed, Emergency Drugs available and Suction available Patient Re-evaluated:Patient Re-evaluated prior to induction Oxygen Delivery Method: Circle system utilized Preoxygenation: Pre-oxygenation with 100% oxygen Induction Type: IV induction Ventilation: Mask ventilation without difficulty Laryngoscope Size: 3 and Miller Grade View: Grade I Tube type: Oral Tube size: 7.5 mm Number of attempts: 1 Airway Equipment and Method: Stylet Placement Confirmation: ETT inserted through vocal cords under direct vision, positive ETCO2 and breath sounds checked- equal and bilateral Secured at: 22 cm Tube secured with: Tape Dental Injury: Teeth and Oropharynx as per pre-operative assessment

## 2021-10-11 NOTE — Op Note (Signed)
PATIENT NAME: [place] RECORD NO.:   [id number]    DATE OF BIRTH: 09-21-1970  DATE OF PROCEDURE: 10/11/2021                                OPERATIVE REPORT   PREOPERATIVE DIAGNOSES: 1. Low back pain. 2. Left L5 radiculopathy. 3. L5-S1 degenerative disk disease. 4. L5/S1 spondylolisthesis 5. L5/S1 spinal stenosis  POSTOPERATIVE DIAGNOSES: 1. Low back pain. 2. Left L5 radiculopathy. 3. L5-S1 degenerative disk disease. 4. L5/S1 spondylolisthesis 5. L5/S1 spinal stenosis  PROCEDURE: 1. Anterior lumbar interbody fusion, L5-S1 2. Insertion of interbody device x 1 (Globus expandable intervertebral spacer). 3. Intraoperative use of fluoroscopy. 4. Posterior spinal fusion, L5-S1. 5. Placement of posterior instrumentation, L5, S1. 6. Use of morselized (Vivigen) 7. Retroperitoneal exposure to L5/S1 (with Dr. Marjean Donna as co-surgeon)  SURGEON:  Phylliss Bob, MD  ASSISTANT:  Pricilla Holm, PA-C  ANESTHESIA:  General endotracheal anesthesia.  COMPLICATIONS:  None.  DISPOSITION:  Stable.  ESTIMATED BLOOD LOSS:  200 mL.  INDICATIONS FOR SURGERY:  Briefly, Mr. Plunkett is a very pleasant 51 y.o. -year- old male, who did present to me with severe and debilitating pain in both his back and left leg, s/p a work injury about 1 year ago.  The patient's MRI was notable for the findings outlined above.  The patient has failed multiple nonoperative measures.  Given his ongoing pain and dysfunction, we did discuss proceeding with the procedure noted above. The patient did elect to proceed.  OPERATIVE DETAILS:  On 10/11/2021, the patient was brought to surgery and general endotracheal anesthesia was administered.  The patient was placed supine on the hospital bed.  The patient's abdomen was prepped and draped in the usual sterile fashion.  An anterior retroperitoneal approach was then performed by myself, and Dr. Marjean Donna, the details of which will dictated on a  separate note. I did function as his cosurgeon during the approach.  Once the anterior lumbar spine was noted, we did focus our attention on the L5-S1 intervertebral space.  I then performed a thorough and complete L5-S1 intervertebral diskectomy to the level of the posterior longitudinal ligament.  I was very pleased with the diskectomy that I was able to accomplish.  The endplates were then appropriately prepared and the appropriate sized anterior intervertebral spacer was packed with Vivigen and tamped into position. The intervertebral spacer was then expanded to approximately 12.5 mm in height. I was very pleased with the press-fit of the implant, and the height restoration.   I was very pleased with the final AP and lateral fluoroscopic images.  The wound was copiously irrigated.  The fascia was closed using #1 PDS.  The subcutaneous layer was closed using 0 Vicryl followed by 2-0 Vicryl, and the skin was closed using 4-0 Monocryl. Benzoin and Steri-Strips were applied followed by sterile dressing.    The patient was then rolled prone onto a Husted spinal bed.  The back was then prepped and draped in the usual sterile fashion.  I then made paramedian incisions on the right and left sides, just lateral to the lateral borders of the pedicles of L5 and S1.  On the left side, the posterolateral gutter and posterior elements were identified and exposed and decorticated.  The remainder of the Vivigen was packed into the posterolateral gutter on the left side to aid in the success of the posterior fusion.  I then tapped the L5, and S1 pedicles bilaterally using a 6 mm tap.  I then placed 7 x 45 mm screws bilaterally at L5 and 7 x 40 mm screws at S1.   Rods were then secured into the tulip heads of the screws bilaterally.  Caps were then placed and a final locking procedure was performed.  I was very pleased with the final AP and lateral fluoroscopic images.  The wound was then copiously  irrigated.  On the right and left sides, the fascia was closed using #1 Vicryl.  The subcutaneous layer was closed using 0 Vicryl followed by 2-0 Vicryl, and the skin was then closed using 4-0 Monocryl. Benzoin and Steri-Strips were applied followed by sterile dressing.  All instrument counts were correct at the termination of the procedure.  Of note, Pricilla Holm was my assistant throughout surgery, and did aid in retraction, placement of the hardware, suctioning, placement of the hardware, and closure for both the anterior and posterior portions of the procedure.   Phylliss Bob, MD

## 2021-10-12 MED ORDER — OXYCODONE-ACETAMINOPHEN 5-325 MG PO TABS
1.0000 | ORAL_TABLET | ORAL | 0 refills | Status: DC | PRN
Start: 2021-10-12 — End: 2023-10-15

## 2021-10-12 MED ORDER — METHOCARBAMOL 500 MG PO TABS: 500.0000 mg | ORAL_TABLET | Freq: Four times a day (QID) | ORAL | 2 refills | Status: DC | PRN

## 2021-10-12 NOTE — Evaluation (Signed)
Occupational Therapy Evaluation Patient Details Name: Noah Liu MRN: [id number] DOB: 04-10-1971 Today's Date: 10/12/2021   History of Present Illness 51 yo male s/p L5-S1 fusion on 2/1. PMH including MI (2007), cardiac cath (2008), and eye sx.   Clinical Impression   PTA, pt was living with his wife and was independent. Currently, pt performing ADLs and functional mobility at independent-Mod I level with increased time for compensatory techniques. Provided education and handout on back precautions, brace management, bed mobility, LB ADLs, toileting, stair management, and tub transfer; pt demonstrated understanding. Answered all pt questions. Recommend dc home once medically stable per physician. All acute OT needs met and will sign off. Thank you.      Recommendations for follow up therapy are one component of a multi-disciplinary discharge planning process, led by the attending physician.  Recommendations may be updated based on patient status, additional functional criteria and insurance authorization.   Follow Up Recommendations  No OT follow up    Assistance Recommended at Discharge Intermittent Supervision/Assistance  Patient can return home with the following      Functional Status Assessment  Patient has had a recent decline in their functional status and demonstrates the ability to make significant improvements in function in a reasonable and predictable amount of time.  Equipment Recommendations  None recommended by OT    Recommendations for Other Services       Precautions / Restrictions Precautions Precautions: Back Precaution Booklet Issued: Yes (comment) Precaution Comments: Providing handout and education on back precautions and compensatory techniques Required Braces or Orthoses: Spinal Brace Spinal Brace: Thoracolumbosacral orthotic;Applied in sitting position Restrictions Weight Bearing Restrictions: No      Mobility Bed Mobility Overal bed mobility:  Independent             General bed mobility comments: using log roll    Transfers Overall transfer level: Independent                        Balance Overall balance assessment: Independent                                         ADL either performed or assessed with clinical judgement   ADL Overall ADL's : Modified independent                                       General ADL Comments: Pt performing at Mod I level with increased time for compensatory techniques. Providing education for back precautions, bed mobility, grooming, UB ADLs, LB ADLs, brace management, toileting, tub transfer, and stair management. Pt demonstrated good understanding.     Vision Patient Visual Report: Other (comment) (dysconjugate gaze at baseline)       Perception     Praxis      Pertinent Vitals/Pain Pain Assessment Pain Assessment: Faces Faces Pain Scale: Hurts a little bit Pain Location: back Pain Descriptors / Indicators: Discomfort Pain Intervention(s): Monitored during session, Repositioned     Hand Dominance     Extremity/Trunk Assessment Upper Extremity Assessment Upper Extremity Assessment: Overall WFL for tasks assessed   Lower Extremity Assessment Lower Extremity Assessment: Defer to PT evaluation   Cervical / Trunk Assessment Cervical / Trunk Assessment: Back Surgery   Communication Communication Communication: No difficulties   Cognition  Arousal/Alertness: Awake/alert °Behavior During Therapy: WFL for tasks assessed/performed °Overall Cognitive Status: Within Functional Limits for tasks assessed °  °  °  °  °  °  °  °  °  °  °  °  °  °  °  °  °  °  °  °General Comments    ° °  °Exercises   °  °Shoulder Instructions    ° ° °Home Living Family/patient expects to be discharged to:: Private residence °Living Arrangements: Spouse/significant other (Wife, 19 yo daughter, and 14 yo son) °Available Help at Discharge: Family;Available  24 hours/day °Type of Home: House °Home Access: Stairs to enter °Entrance Stairs-Number of Steps: 3 °Entrance Stairs-Rails: Left °Home Layout: One level °  °  °Bathroom Shower/Tub: Tub/shower unit °  °Bathroom Toilet: Handicapped height °  °  °Home Equipment: None °  °  °  ° °  °Prior Functioning/Environment Prior Level of Function : Independent/Modified Independent °  °  °  °  °  °  °  °ADLs Comments: ADLs, IADLs, driving. Works in [employer] for [employer]. °  ° °  °  °OT Problem List: Decreased range of motion;Decreased knowledge of use of DME or AE;Decreased knowledge of precautions °  °   °OT Treatment/Interventions:    °  °OT Goals(Current goals can be found in the care plan section) Acute Rehab OT Goals °Patient Stated Goal: Go home °OT Goal Formulation: All assessment and education complete, DC therapy  °OT Frequency:   °  ° °Co-evaluation   °  °  °  °  ° °  °AM-PAC OT "6 Clicks" Daily Activity     °Outcome Measure Help from another person eating meals?: None °Help from another person taking care of personal grooming?: None °Help from another person toileting, which includes using toliet, bedpan, or urinal?: None °Help from another person bathing (including washing, rinsing, drying)?: None °Help from another person to put on and taking off regular upper body clothing?: None °Help from another person to put on and taking off regular lower body clothing?: None °6 Click Score: 24 °  °End of Session Equipment Utilized During Treatment: Back brace °Nurse Communication: Mobility status ° °Activity Tolerance: Patient tolerated treatment well °Patient left: in chair;with call bell/phone within reach ° °OT Visit Diagnosis: Muscle weakness (generalized) (M62.81);Pain °Pain - part of body:  (back)  °              °Time: 0752-0813 °OT Time Calculation (min): 21 min °Charges:  OT General Charges °$OT Visit: 1 Visit °OT Evaluation °$OT Eval Low Complexity: 1 Low ° °Charis Capehart MSOT, OTR/L °Acute Rehab °Pager:  [phone] °Office: [phone] ° °Charis M Capehart °10/12/2021, 9:02 AM °

## 2021-10-12 NOTE — Progress Notes (Signed)
° ° °  Patient doing very well  Patient denies leg pain Has been ambulating Back pain is minimal   Physical Exam: Vitals:   10/11/21 2319 10/12/21 0339  BP: 132/84 125/75  Pulse: 88 87  Resp: 20 20  Temp: 98.9 F (37.2 C) 98.3 F (36.8 C)  SpO2: 99% 98%    Dressing in place NVI  POD #1 s/p L5/S1 fusion, doing very well, with resolved leg pain  - encourage ambulation - Percocet for pain, Robaxin for muscle spasms - d/c home today with f/u in 2 weeks, with or without therapy

## 2021-10-12 NOTE — Plan of Care (Signed)

## 2021-10-12 NOTE — Progress Notes (Signed)
Patient awaiting transport via wheelchair by volunteer for discharge home; in no acute distress nor complaints of pain nor discomfort; incision on his back and lower abdomen with gauze dressing and were clean, dry and intact with TLSO brace on; room was checked and accounted for all his belongings; discharge instructions concerning his medications, incision care, follow up appointment and when to call the doctor as needed were all discussed with patient and he expressed understanding on the instructions given.

## 2021-10-12 NOTE — Progress Notes (Signed)
PT Cancellation Note  Patient Details Name: REVIS WHALIN MRN: [id number] DOB: 07-22-1971   Cancelled Treatment:    Reason Eval/Treat Not Completed: PT screened, no needs identified, will sign off. Discussed pt case with OT who reports pt is independent with all mobility at this time and does not require a physical therapy evaluation at this time. Will sign off at this time. If needs change, please reconsult.   Marylynn Pearson 10/12/2021, 10:36 AM  Conni Slipper, PT, DPT Acute Rehabilitation Services Pager: [phone] Office: [phone]

## 2021-10-13 MED FILL — Thrombin For Soln Kit 20000 Unit: CUTANEOUS | Qty: 1 | Status: AC

## 2021-10-16 MED FILL — Heparin Sodium (Porcine) Inj 1000 Unit/ML: INTRAMUSCULAR | Qty: 30 | Status: AC

## 2021-10-16 MED FILL — Sodium Chloride IV Soln 0.9%: INTRAVENOUS | Qty: 1000 | Status: AC

## 2021-10-17 ENCOUNTER — Encounter (HOSPITAL_COMMUNITY): Payer: Self-pay | Admitting: Orthopedic Surgery

## 2021-10-25 NOTE — Discharge Summary (Signed)
Patient ID: Noah Liu MRN: [id number] DOB/AGE: 50-Jul-1972 50 y.o.  Admit date: 10/11/2021 Discharge date: 10/12/2021  Admission Diagnoses:  Principal Problem:   Radiculopathy   Discharge Diagnoses:  Same  Past Medical History:  Diagnosis Date   Acute MI [facility]) 2007   Complication of anesthesia    GERD (gastroesophageal reflux disease)    Headache    Pneumonia    PONV (postoperative nausea and vomiting)    Reflux     Surgeries: Procedure(s): LUMBAR FIVE - SACRUM ONE ANTERIOR LUMBAR INTERBODY FUSION WITH POSTERIOR SPINAL FUSION WITH INSTRUMENTATION AND ALLOGRAFT ABDOMINAL EXPOSURE on 10/11/2021   Consultants: Vascular, Dr Butch Penny  Discharged Condition: Improved  Hospital Course: Noah Liu is an 51 y.o. male who was admitted 10/11/2021 for operative treatment of Radiculopathy. Patient has severe unremitting pain that affects sleep, daily activities, and work/hobbies. After pre-op clearance the patient was taken to the operating room on 10/11/2021 and underwent  Procedure(s): LUMBAR FIVE - SACRUM ONE ANTERIOR LUMBAR INTERBODY FUSION WITH POSTERIOR SPINAL FUSION WITH INSTRUMENTATION AND ALLOGRAFT ABDOMINAL EXPOSURE.    Patient was given perioperative antibiotics:  Anti-infectives (From admission, onward)    Start     Dose/Rate Route Frequency Ordered Stop   10/11/21 1600  ceFAZolin (ANCEF) IVPB 2g/100 mL premix        2 g 200 mL/hr over 30 Minutes Intravenous Every 8 hours 10/11/21 1505 10/11/21 2345   10/11/21 0700  ceFAZolin (ANCEF) IVPB 2g/100 mL premix        2 g 200 mL/hr over 30 Minutes Intravenous On call to O.R. 10/11/21 9166 10/11/21 1233        Patient was given sequential compression devices, early ambulation to prevent DVT.  Patient benefited maximally from hospital stay and there were no complications.    Recent vital signs: BP 120/78 (BP Location: Right Arm)    Pulse 86    Temp 98.4 F (36.9 C) (Oral)    Resp 18    Ht 6\' 2"  (1.88 m)    Wt  82.6 kg    SpO2 98%    BMI 23.37 kg/m    Discharge Medications:   Allergies as of 10/12/2021       Reactions   Aspirin Other (See Comments)   Upset stomach and causes bleeding ulcers        Medication List     TAKE these medications    methocarbamol 500 MG tablet Commonly known as: ROBAXIN Take 500 mg by mouth 2 (two) times daily. What changed: Another medication with the same name was added. Make sure you understand how and when to take each.   methocarbamol 500 MG tablet Commonly known as: ROBAXIN Take 1 tablet (500 mg total) by mouth every 6 (six) hours as needed for muscle spasms. What changed: You were already taking a medication with the same name, and this prescription was added. Make sure you understand how and when to take each.   oxyCODONE-acetaminophen 5-325 MG tablet Commonly known as: PERCOCET/ROXICET Take 1-2 tablets by mouth every 4 (four) hours as needed for severe pain.        Diagnostic Studies: DG Lumbar Spine 2-3 Views  Result Date: 10/11/2021 CLINICAL DATA:  Fluoroscopic assistance for lumbar fusion EXAM: LUMBAR SPINE - 2-3 VIEW COMPARISON:  None. FINDINGS: Fluoroscopic images show posterior lumbar fusion at L5-S1 level. Intervertebral disc spacer is noted in place. Fluoroscopic time was 150 seconds. Radiation dose is 81.4 mGy. IMPRESSION: Fluoroscopic assistance was provided for lumbar  fusion at L5-S1 level. Electronically Signed   By: Ernie Avena M.D.   On: 10/11/2021 12:58   DG C-Arm 1-60 Min-No Report  Result Date: 10/11/2021 Fluoroscopy was utilized by the requesting physician.  No radiographic interpretation.   DG C-Arm 1-60 Min-No Report  Result Date: 10/11/2021 Fluoroscopy was utilized by the requesting physician.  No radiographic interpretation.   DG C-Arm 1-60 Min-No Report  Result Date: 10/11/2021 Fluoroscopy was utilized by the requesting physician.  No radiographic interpretation.   DG C-Arm 1-60 Min-No Report  Result Date:  10/11/2021 Fluoroscopy was utilized by the requesting physician.  No radiographic interpretation.   DG OR LOCAL ABDOMEN  Result Date: 10/11/2021 CLINICAL DATA:  Evaluate for foreign body EXAM: OR LOCAL ABDOMEN COMPARISON:  None. FINDINGS: Surgical hardware is seen at L5-S1 level. No other opaque foreign bodies are seen. Bowel gas pattern is nonspecific. IMPRESSION: No radiopaque foreign body is seen. Imaging findings were relayed to patient's nurse by telephone call. Electronically Signed   By: Ernie Avena M.D.   On: 10/11/2021 11:27    Disposition: Discharge disposition: 01-Home or Self Care        POD #1 s/p L5/S1 fusion, doing very well, with resolved leg pain   - encourage ambulation - Percocet for pain, Robaxin for muscle spasms -Scripts for pain sent to pharmacy electronically  -D/C instructions sheet printed and in chart -D/C today  -F/U in office 2 weeks   Signed: Eilene Ghazi Artavius Stearns 10/25/2021, 1:06 PM

## 2022-02-01 ENCOUNTER — Encounter (HOSPITAL_BASED_OUTPATIENT_CLINIC_OR_DEPARTMENT_OTHER): Payer: Self-pay | Admitting: Emergency Medicine

## 2022-02-01 ENCOUNTER — Emergency Department (HOSPITAL_BASED_OUTPATIENT_CLINIC_OR_DEPARTMENT_OTHER)
Admission: EM | Admit: 2022-02-01 | Discharge: 2022-02-01 | Disposition: A | Discharge status: Home / Self Care | Payer: BC Managed Care – PPO | Attending: Emergency Medicine | Admitting: Emergency Medicine

## 2022-02-01 ENCOUNTER — Other Ambulatory Visit: Payer: Self-pay

## 2022-02-01 DIAGNOSIS — I10 Essential (primary) hypertension: Secondary | ICD-10-CM | POA: Diagnosis not present

## 2022-02-01 DIAGNOSIS — R202 Paresthesia of skin: Secondary | ICD-10-CM | POA: Insufficient documentation

## 2022-02-01 DIAGNOSIS — R519 Headache, unspecified: Secondary | ICD-10-CM | POA: Diagnosis present

## 2022-02-01 LAB — BASIC METABOLIC PANEL
Anion gap: 7 (ref 5–15)
BUN: 14 mg/dL (ref 6–20)
CO2: 28 mmol/L (ref 22–32)
Calcium: 9.5 mg/dL (ref 8.9–10.3)
Chloride: 102 mmol/L (ref 98–111)
Creatinine, Ser: 1.25 mg/dL — ABNORMAL HIGH (ref 0.61–1.24)
GFR, Estimated: >60 mL/min (ref >60)
Glucose, Bld: 111 mg/dL — ABNORMAL HIGH (ref 70–99)
Potassium: 4 mmol/L (ref 3.5–5.1)
Sodium: 137 mmol/L (ref 135–145)

## 2022-02-01 LAB — CBC
HCT: 43 % (ref 39.0–52.0)
Hemoglobin: 13.6 g/dL (ref 13.0–17.0)
MCH: 26.5 pg (ref 26.0–34.0)
MCHC: 31.6 g/dL (ref 30.0–36.0)
MCV: 83.7 fL (ref 80.0–100.0)
Platelets: 217 10*3/uL (ref 150–400)
RBC: 5.14 MIL/uL (ref 4.22–5.81)
RDW: 13.2 % (ref 11.5–15.5)
WBC: 6.7 10*3/uL (ref 4.0–10.5)
nRBC: 0 % (ref 0.0–0.2)

## 2022-02-01 LAB — TROPONIN I (HIGH SENSITIVITY)
Troponin I (High Sensitivity): <2 ng/L (ref <18)
Troponin I (High Sensitivity): <2 ng/L (ref <18)

## 2022-02-01 MED ORDER — PROCHLORPERAZINE EDISYLATE 10 MG/2ML IJ SOLN
10.0000 mg | Freq: Once | INTRAMUSCULAR | Status: AC
Start: 2022-02-01 — End: 2022-02-01
  Administered 2022-02-01: 10 mg via INTRAVENOUS
  Filled 2022-02-01: qty 2

## 2022-02-01 MED ORDER — SODIUM CHLORIDE 0.9 % IV BOLUS
1000.0000 mL | Freq: Once | INTRAVENOUS | Status: AC
  Administered 2022-02-01: 1000 mL via INTRAVENOUS

## 2022-02-01 MED ORDER — DIPHENHYDRAMINE HCL 50 MG/ML IJ SOLN
50.0000 mg | Freq: Once | INTRAMUSCULAR | Status: AC
  Administered 2022-02-01: 50 mg via INTRAVENOUS
  Filled 2022-02-01: qty 1

## 2022-02-01 NOTE — ED Provider Notes (Signed)
[place] EMERGENCY DEPARTMENT Provider Note   CSN: [id number] Arrival date & time: 02/01/22  1343     History  Chief Complaint  Patient presents with   Hypertension    Noah Liu is a 51 y.o. male.  The history is provided by the patient and medical records. No language interpreter was used.  Hypertension This is a new problem. The current episode started more than 2 days ago. The problem occurs constantly. The problem has not changed since onset.Associated symptoms include headaches. Pertinent negatives include no chest pain, no abdominal pain and no shortness of breath. Nothing aggravates the symptoms. Nothing relieves the symptoms. He has tried nothing for the symptoms. The treatment provided no relief.      Home Medications Prior to Admission medications   Medication Sig Start Date End Date Taking? Authorizing Provider  methocarbamol (ROBAXIN) 500 MG tablet Take 500 mg by mouth 2 (two) times daily. 04/12/21   [provider]  methocarbamol (ROBAXIN) 500 MG tablet Take 1 tablet (500 mg total) by mouth every 6 (six) hours as needed for muscle spasms. 10/12/21   Phylliss Bob, MD  oxyCODONE-acetaminophen (PERCOCET/ROXICET) 5-325 MG tablet Take 1-2 tablets by mouth every 4 (four) hours as needed for severe pain. 10/12/21   Phylliss Bob, MD      Allergies    Aspirin    Review of Systems   Review of Systems  Constitutional:  Negative for chills, diaphoresis, fatigue and fever.  HENT:  Negative for congestion.   Eyes:  Positive for visual disturbance.  Respiratory:  Negative for cough, chest tightness, shortness of breath and wheezing.   Cardiovascular:  Negative for chest pain and palpitations.  Gastrointestinal:  Negative for abdominal distention, abdominal pain, constipation, diarrhea, nausea and vomiting.  Genitourinary:  Negative for dysuria.  Musculoskeletal:  Negative for back pain, neck pain and neck stiffness.  Skin:  Negative for rash and  wound.  Neurological:  Positive for light-headedness, numbness (transietn tingling of left fingers) and headaches. Negative for dizziness, syncope, facial asymmetry, speech difficulty and weakness.  Psychiatric/Behavioral:  Negative for agitation and confusion.   All other systems reviewed and are negative.  Physical Exam Updated Vital Signs BP (!) 140/97 (BP Location: Right Arm)   Pulse 68   Temp 98.2 F (36.8 C) (Oral)   Resp 13   Ht 6\' 2"  (1.88 m)   Wt 81.6 kg   SpO2 100%   BMI 23.11 kg/m  Physical Exam Vitals and nursing note reviewed.  Constitutional:      General: He is not in acute distress.    Appearance: He is well-developed. He is not ill-appearing, toxic-appearing or diaphoretic.  HENT:     Head: Normocephalic and atraumatic.     Nose: No congestion or rhinorrhea.     Mouth/Throat:     Pharynx: No oropharyngeal exudate or posterior oropharyngeal erythema.  Eyes:     Extraocular Movements: Extraocular movements intact.     Conjunctiva/sclera: Conjunctivae normal.     Pupils: Pupils are equal, round, and reactive to light.  Cardiovascular:     Rate and Rhythm: Normal rate and regular rhythm.     Heart sounds: No murmur heard. Pulmonary:     Effort: Pulmonary effort is normal. No respiratory distress.     Breath sounds: Normal breath sounds. No wheezing, rhonchi or rales.  Chest:     Chest wall: No tenderness.  Abdominal:     General: Abdomen is flat.     Palpations:  Abdomen is soft.     Tenderness: There is no abdominal tenderness. There is no right CVA tenderness, left CVA tenderness, guarding or rebound.  Musculoskeletal:        General: No swelling or tenderness.     Cervical back: Neck supple. No tenderness.     Right lower leg: No edema.     Left lower leg: No edema.  Skin:    General: Skin is warm and dry.     Capillary Refill: Capillary refill takes less than 2 seconds.     Findings: No erythema.  Neurological:     General: No focal deficit  present.     Mental Status: He is alert.     Cranial Nerves: No cranial nerve deficit.     Sensory: No sensory deficit.     Motor: No weakness.     Coordination: Coordination normal.  Psychiatric:        Mood and Affect: Mood normal.    ED Results / Procedures / Treatments   Labs (all labs ordered are listed, but only abnormal results are displayed) Labs Reviewed  BASIC METABOLIC PANEL - Abnormal; Notable for the following components:      Result Value   Glucose, Bld 111 (*)    Creatinine, Ser 1.25 (*)    All other components within normal limits  CBC  TROPONIN I (HIGH SENSITIVITY)  TROPONIN I (HIGH SENSITIVITY)    EKG EKG Interpretation  Date/Time:  Thursday Feb 01 2022 14:00:05 EDT Ventricular Rate:  73 PR Interval:  156 QRS Duration: 76 QT Interval:  354 QTC Calculation: 389 R Axis:   64 Text Interpretation: Normal sinus rhythm Normal ECG When compared with ECG of 09-Oct-2021 13:40, PREVIOUS ECG IS PRESENT when compared to prior, similar appearance. NO sTEMI Confirmed by Antony Blackbird [phone]) on 02/01/2022 4:11:45 PM  Radiology No results found.  Procedures Procedures    Medications Ordered in ED Medications  sodium chloride 0.9 % bolus 1,000 mL (0 mLs Intravenous Stopped 02/01/22 1840)  prochlorperazine (COMPAZINE) injection 10 mg (10 mg Intravenous Given 02/01/22 1524)  diphenhydrAMINE (BENADRYL) injection 50 mg (50 mg Intravenous Given 02/01/22 1524)    ED Course/ Medical Decision Making/ A&P                           Medical Decision Making Amount and/or Complexity of Data Reviewed Labs: ordered. ECG/medicine tests: ordered and independent interpretation performed.  Risk Prescription drug management.    Noah Liu is a 51 y.o. male with a past medical history significant for headaches, GERD, congenital right eye blindness, previous back surgery with some residual left leg numbness at baseline, and reported previous MI who presents with  headaches and intermittent left finger tingling and intermittent left blurry vision.  Patient reports that it has been "quite some time" since he has had headaches like these but for the last week has had headaches on and off.  He reports the headache is across the front of his head and is moderate to severe.  He says that his worst it was "20 out of 10" but is now a 6 out of 10.  He reports no nausea, vomiting, constipation, diarrhea, or urinary changes.  Denies any head injury.  Denies any neck pain or neck stiffness.  Denies any fevers or chills.  He says that he does not see out of his right eye at all and with his headache being severe sometimes gets blurry  vision in his left eye.  He also reports that his fingers have been tingling intermittently on the left arm that comes and goes.  He is not feeling it now.  He denies any history of clotting disorder or history of dural venous sinus thrombus.  No history of stroke.  He denies any other complaints today.  No chest pain, palpitations, or shortness of breath.  On exam, lungs clear and chest nontender.  Abdomen nontender.  Patient had no focal neurologic deficits on my exam with intact sensation, strength, and pulses in all extremity.  Normal finger-nose-finger testing.  Symmetric smile.  Clear speech.  Patient had negative Hoffmann sign in both hands.  He did not have any true numbness on my exam but he reported slight tingling in several of his fingers on the left hand.  His right pupil is abnormal which she reports is at baseline.  Left pupil is symmetric and reactive normal extraocular evidence.  No carotid bruit appreciated.  No rashes seen.  Patient denies any tick exposures.  Had a shared decision made conversation with patient today.  Given his 1 week of severe headaches and his transient neurologic complaints, we discussed getting a CT or CTV to look for intracranial abnormality however patient would rather just get medicines to help with his  headaches and see if his symptoms improve.  We discussed that this is suspicious for a complex migraine or complicated headache and if symptoms improved with medications this is likely the cause.  He did not have any fevers or chills making meningitis less likely and has not had any neck pain or neck stiffness.  No traumatic injuries reported.  No evidence of trauma on exam.  With his waxing waning symptoms we have low suspicion this is a stroke and his neurologic symptoms are reportedly only present when the headache is most severe.  If headache has improved, dissipate discharge home with outpatient follow-up.  We will hold on CT imaging or other work-up at this time.          6:37 PM Patient had some screening labs in triage that were overall reassuring.  His creatinine was 1.3 last time and is now 1.2 and improving.  He reports complete resolution of his headache, tingling in the fingers, and has no vision changes.  He reports his symptoms have resolved.  Given his symptom resolution we still feel it is safe to discharge him and agreed to hold on CT imaging.  He knows to follow-up with a primary doctor.  His blood pressure has been in the 130s and 140s so do not feel he is to be started emergently on blood pressure medicine at this time however if he proves when he sees his PCP to have more persistent elevated blood pressures they may want to start at that time.  Patient is in agreement with this plan and he will be discharged.        Final Clinical Impression(s) / ED Diagnoses Final diagnoses:  Acute nonintractable headache, unspecified headache type  Tingling    Rx / DC Orders ED Discharge Orders     None       Clinical Impression: 1. Acute nonintractable headache, unspecified headache type   2. Tingling     Disposition: Discharge  Condition: Good  I have discussed the results, Dx and Tx plan with the pt(& family if present). He/she/they expressed understanding and  agree(s) with the plan. Discharge instructions discussed at great length. Strict return precautions discussed  and pt &/or family have verbalized understanding of the instructions. No further questions at time of discharge.    New Prescriptions   No medications on file    Follow Up: your PCP     [facility] [place] [phone] Schedule an appointment as soon as possible for a visit    [place] [address] [id number] [place] [employer] [phone]        Tomy Khim, Gwenyth Allegra, MD 02/01/22 1840

## 2022-02-01 NOTE — Discharge Instructions (Signed)
Your history, exam, and work-up today are consistent with a complicated headache or complicated migraine causing the transient vision changes and tingling sensation when the headache was present that have resolved when the headache resolved.  We had a shared decision-making conversation offering CT imaging today however given your resolution symptoms we agreed to hold on imaging at this time.  Your blood pressure waxed and waned but did decrease to a more normal range and your headache improved.  We would like you to follow-up with your primary doctor to discuss blood pressure management and further headache management however we feel you are safe for discharge home.  If any symptoms change or worsen acutely, please return to the nearest Emergency Department.

## 2022-02-01 NOTE — ED Triage Notes (Signed)
Was at physical therapy when BP was checked and read 150/100/. Sent here for further evaluation. Reports headache x 1 week.

## 2022-10-15 IMAGING — CR DG OR LOCAL ABDOMEN
1 series · 1 of 1 positions shown · non-contrast
Comparison: None.

CLINICAL DATA: Evaluate for foreign body

EXAM:
OR LOCAL ABDOMEN

[AP]
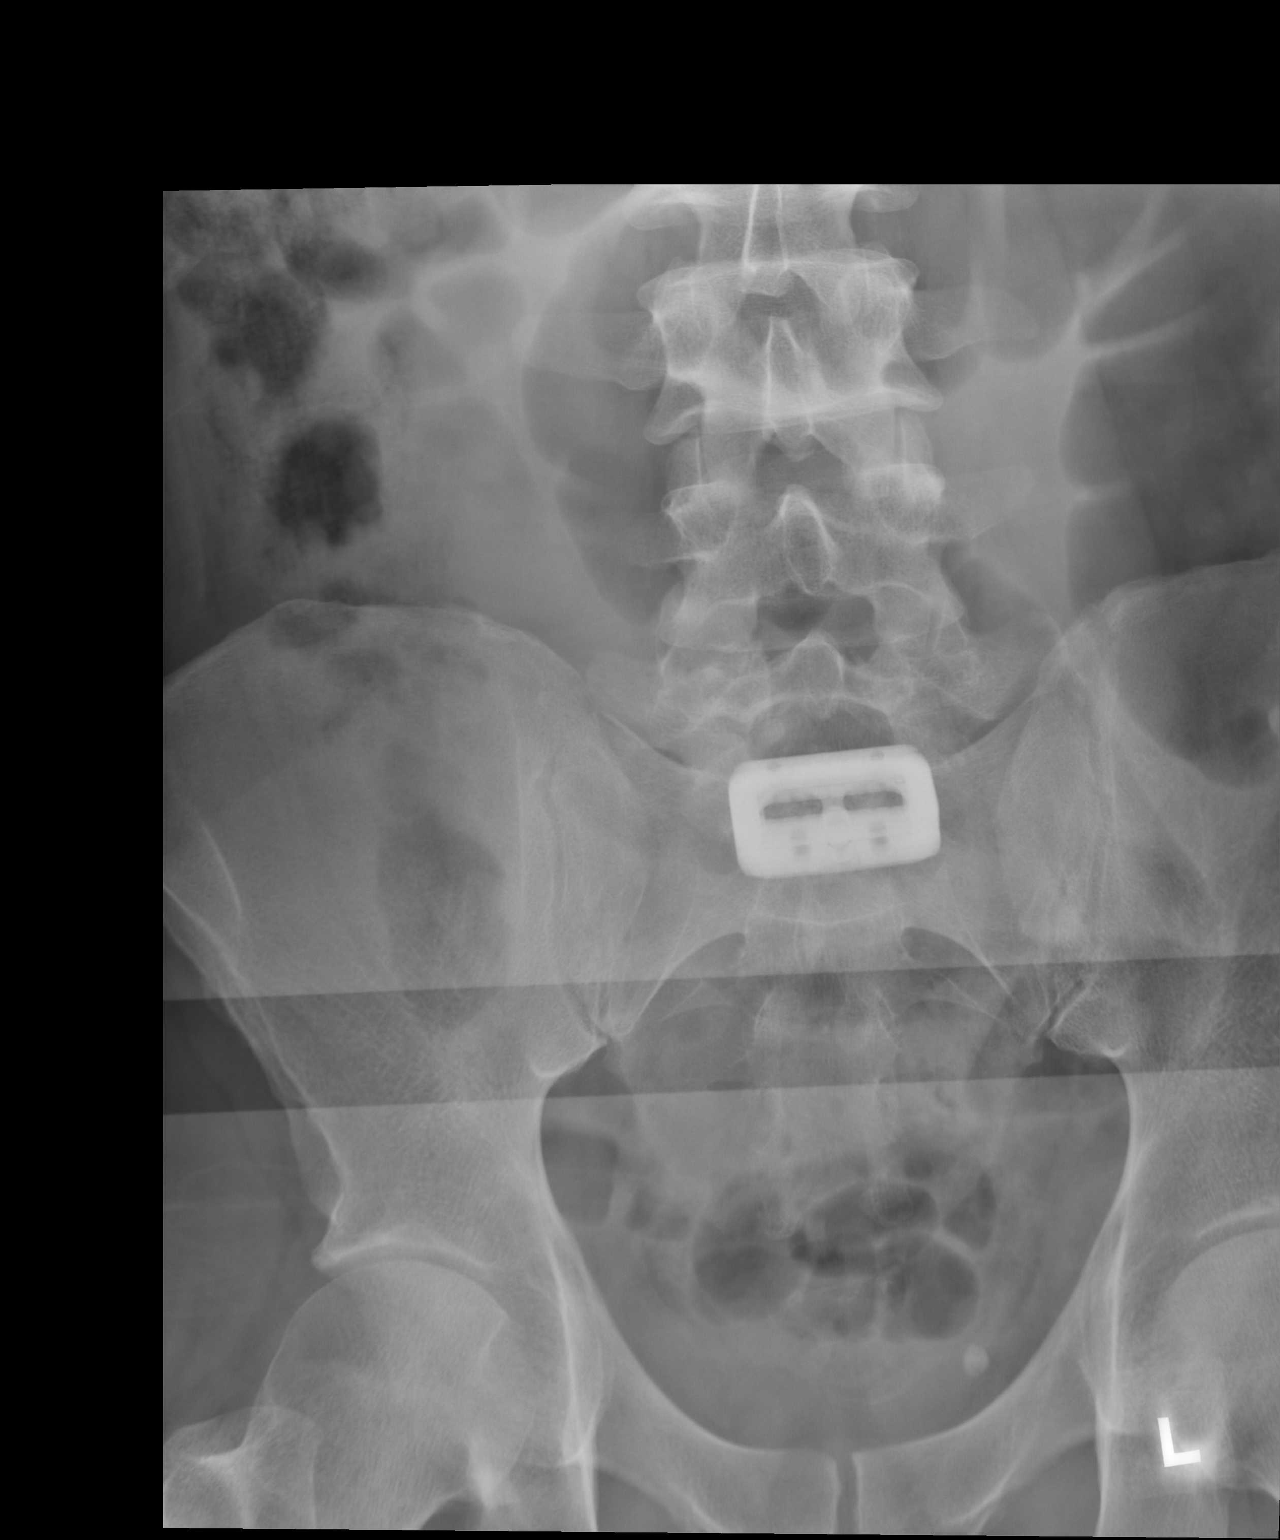

[1 of 1 positions shown; findings below may reference images not displayed]

FINDINGS: Surgical hardware is seen at L5-S1 level. No other opaque foreign
bodies are seen. Bowel gas pattern is nonspecific.
IMPRESSION: No radiopaque foreign body is seen.

Imaging findings were relayed to patient's nurse by telephone call.

## 2023-09-22 ENCOUNTER — Emergency Department (HOSPITAL_BASED_OUTPATIENT_CLINIC_OR_DEPARTMENT_OTHER): Payer: BLUE CROSS/BLUE SHIELD

## 2023-09-22 ENCOUNTER — Encounter (HOSPITAL_BASED_OUTPATIENT_CLINIC_OR_DEPARTMENT_OTHER): Payer: Self-pay | Admitting: Emergency Medicine

## 2023-09-22 ENCOUNTER — Other Ambulatory Visit: Payer: Self-pay

## 2023-09-22 ENCOUNTER — Emergency Department (HOSPITAL_BASED_OUTPATIENT_CLINIC_OR_DEPARTMENT_OTHER)
Admission: EM | Admit: 2023-09-22 | Discharge: 2023-09-22 | Disposition: A | Discharge status: Home / Self Care | Payer: BLUE CROSS/BLUE SHIELD | Attending: Emergency Medicine | Admitting: Emergency Medicine

## 2023-09-22 DIAGNOSIS — R0789 Other chest pain: Secondary | ICD-10-CM | POA: Insufficient documentation

## 2023-09-22 DIAGNOSIS — I10 Essential (primary) hypertension: Secondary | ICD-10-CM | POA: Diagnosis not present

## 2023-09-22 DIAGNOSIS — R079 Chest pain, unspecified: Secondary | ICD-10-CM

## 2023-09-22 HISTORY — DX: Essential (primary) hypertension: I10

## 2023-09-22 LAB — CBC
HCT: 42.2 % (ref 39.0–52.0)
Hemoglobin: 13.1 g/dL (ref 13.0–17.0)
MCH: 26.5 pg (ref 26.0–34.0)
MCHC: 31 g/dL (ref 30.0–36.0)
MCV: 85.4 fL (ref 80.0–100.0)
Platelets: 213 10*3/uL (ref 150–400)
RBC: 4.94 MIL/uL (ref 4.22–5.81)
RDW: 13.1 % (ref 11.5–15.5)
WBC: 5.7 10*3/uL (ref 4.0–10.5)
nRBC: 0 % (ref 0.0–0.2)

## 2023-09-22 LAB — BASIC METABOLIC PANEL
Anion gap: 6 (ref 5–15)
BUN: 16 mg/dL (ref 6–20)
CO2: 27 mmol/L (ref 22–32)
Calcium: 8.4 mg/dL — ABNORMAL LOW (ref 8.9–10.3)
Chloride: 102 mmol/L (ref 98–111)
Creatinine, Ser: 1.25 mg/dL — ABNORMAL HIGH (ref 0.61–1.24)
GFR, Estimated: >60 mL/min (ref >60)
Glucose, Bld: 99 mg/dL (ref 70–99)
Potassium: 3.9 mmol/L (ref 3.5–5.1)
Sodium: 135 mmol/L (ref 135–145)

## 2023-09-22 LAB — TROPONIN I (HIGH SENSITIVITY)
Troponin I (High Sensitivity): <2 ng/L (ref <18)
Troponin I (High Sensitivity): <2 ng/L (ref <18)

## 2023-09-22 LAB — D-DIMER, QUANTITATIVE: D-Dimer, Quant: 0.31 ug{FEU}/mL (ref 0.00–0.50)

## 2023-09-22 MED ORDER — SODIUM CHLORIDE 0.9% FLUSH
3.0000 mL | Freq: Once | INTRAVENOUS | Status: AC
  Administered 2023-09-22: 3 mL via INTRAVENOUS
  Filled 2023-09-22: qty 3

## 2023-09-22 NOTE — Discharge Instructions (Signed)
 You were seen in the emergency department today for chest pain.  As we discussed your lab work, EKG, chest x-ray all looked reassuring today.  I think that your symptoms could be related to your heart, which is why we want you to follow up with a cardiologist.   I recommend monitoring your stress levels.  Continue to monitor how you are doing overall, and return to the emergency department for any new or worsening symptoms such as: Worsening pain or pain with exertion, difficulty breathing, sweating, or pain or swelling in your legs.  I've sent a referral to the cardiologist for you. If you do not hear from them in the next 72 hrs, reach out to them.

## 2023-09-22 NOTE — ED Notes (Signed)
 ED Provider at bedside.

## 2023-09-22 NOTE — ED Triage Notes (Signed)
 Chest pain and dizziness since Monday.  No known fever.  Some sob.  No N/V.   Pt states pain on the left side, non-radiating.  Denies any cold symptoms.

## 2023-09-22 NOTE — ED Notes (Signed)
 BMP, Ddimer sent to lab

## 2023-09-22 NOTE — ED Provider Notes (Signed)
 [place] EMERGENCY DEPARTMENT AT [facility] Provider Note   CSN: [id number] Arrival date & time: 09/22/23  1343     History  Chief Complaint  Patient presents with   Chest Pain    Noah Liu is a 53 y.o. male with history of MI in 2007, GERD, HTN, chronic headaches, blindness in R eye, who presents to the ER complaining of chest pain and dizziness x 6 days. 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 It has been persistent since then, maybe gradually lessening. Has tried tylenol  without relief. No aggravating or alleviating factors. Describes pain as stabbing and localizes to the L side of the chest, does not radiate. Associated lightheadedness and dizziness, no syncope.   The patient denies any changes in bowel or bladder habits, sick contacts, fevers, or chills. The patient drinks one cup of coffee a day and does not drink any energy drinks. The patient vapes and drinks alcohol occasionally for special occasions.   In addition to his chest pain, the patient is also complaining of numbness and tingling in his left index finger that began yesterday. The feeling seems to come and go throughout the day.    Chest Pain Associated symptoms: dizziness and shortness of breath        Home Medications Prior to Admission medications   Medication Sig Start Date End Date Taking? Authorizing Provider  methocarbamol  (ROBAXIN ) 500 MG tablet Take 500 mg by mouth 2 (two) times daily. 04/12/21   [provider]  methocarbamol  (ROBAXIN ) 500 MG tablet Take 1 tablet (500 mg total) by mouth every 6 (six) hours as needed for muscle spasms. 10/12/21   Beuford Anes, MD  oxyCODONE -acetaminophen  (PERCOCET/ROXICET) 5-325 MG tablet Take 1-2 tablets by mouth every 4 (four) hours as needed for severe pain. 10/12/21   Beuford Anes, MD      Allergies    Aspirin     Review of Systems   Review of Systems  Respiratory:  Positive for shortness of  breath.   Cardiovascular:  Positive for chest pain.  Neurological:  Positive for dizziness and light-headedness. Negative for syncope.  All other systems reviewed and are negative.   Physical Exam Updated Vital Signs BP 119/83 (BP Location: Right Arm)   Pulse 74   Temp 99 F (37.2 C)   Resp 16   Ht 6' 1 (1.854 m)   Wt 83.9 kg   SpO2 100%   BMI 24.41 kg/m  Physical Exam Vitals and nursing note reviewed.  Constitutional:      Appearance: Normal appearance.  HENT:     Head: Normocephalic and atraumatic.  Eyes:     Conjunctiva/sclera: Conjunctivae normal.  Cardiovascular:     Rate and Rhythm: Normal rate and regular rhythm.  Pulmonary:     Effort: Pulmonary effort is normal. No respiratory distress.     Breath sounds: Normal breath sounds.  Abdominal:     General: There is no distension.     Palpations: Abdomen is soft.     Tenderness: There is no abdominal tenderness.  Musculoskeletal:     Right lower leg: No edema.     Left lower leg: No edema.     Comments: Compartments of lower legs are soft, nontender  Skin:    General: Skin is warm and dry.  Neurological:     General: No focal deficit present.     Mental Status: He is alert.     ED Results /  Procedures / Treatments   Labs (all labs ordered are listed, but only abnormal results are displayed) Labs Reviewed  BASIC METABOLIC PANEL - Abnormal; Notable for the following components:      Result Value   Creatinine, Ser 1.25 (*)    Calcium  8.4 (*)    All other components within normal limits  CBC  D-DIMER, QUANTITATIVE (NOT AT [facility])  TROPONIN I (HIGH SENSITIVITY)  TROPONIN I (HIGH SENSITIVITY)    EKG EKG Interpretation Date/Time:  Sunday September 22 2023 13:53:08 EST Ventricular Rate:  80 PR Interval:  152 QRS Duration:  74 QT Interval:  354 QTC Calculation: 408 R Axis:   64  Text Interpretation: Normal sinus rhythm Normal ECG When compared with ECG of 01-Feb-2022 14:00, PREVIOUS ECG IS PRESENT  similar to prior no stemi Confirmed by Elnor Savant ([id number]) on 09/22/2023 3:38:56 PM  Radiology DG Chest 2 View Result Date: 09/22/2023 CLINICAL DATA:  Chest pain.  Dizziness. EXAM: CHEST - 2 VIEW COMPARISON:  03/22/2018 FINDINGS: The cardiomediastinal contours are normal. The lungs are clear. Pulmonary vasculature is normal. No consolidation, pleural effusion, or pneumothorax. No acute osseous abnormalities are seen. IMPRESSION: Negative radiographs of the chest. Electronically Signed   By: Andrea Gasman M.D.   On: 09/22/2023 15:08    Procedures Procedures    Medications Ordered in ED Medications  sodium chloride  flush (NS) 0.9 % injection 3 mL (3 mLs Intravenous Given 09/22/23 1418)    ED Course/ Medical Decision Making/ A&P             HEART Score: 4                    Medical Decision Making Amount and/or Complexity of Data Reviewed Labs: ordered. Radiology: ordered.   This patient is a 53 y.o. male  who presents to the ED for concern of chest pain x 6 days.   Differential diagnoses prior to evaluation: The emergent differential diagnosis includes, but is not limited to,  ACS, pericarditis, myocarditis, aortic dissection, PE, pneumothorax, esophageal spasm or rupture, chronic angina, pneumonia, bronchitis, GERD, reflux/PUD, biliary disease, pancreatitis, costochondritis, anxiety. This is not an exhaustive differential.   Past Medical History / Co-morbidities / Social History: MI in 2007, GERD, HTN, chronic headaches, blindness in R eye  Additional history: Chart reviewed. Pertinent results include: Reviewed record, reportedly had myocardial perfusion scan in 2008 that demonstrated stress-induced ischemia in anterior wall, but cardiac cath showed no obstruction. Patient was not placed on any medication.   Physical Exam: Physical exam performed. The pertinent findings include: Normal vitals, no distress. Heart regular rate and rhythm, lung sounds clear, no peripheral edema.    Lab Tests/Imaging studies: I personally interpreted labs/imaging and the pertinent results include:  CBC and BMP unremarkable, kidney function at baseline. D-dimer 0.31, negative. Negative troponins x 2.   CXR without acute abnormalities. I agree with the radiologist interpretation.  Cardiac monitoring: EKG obtained and interpreted by myself and attending physician which shows: NSR   Consultations obtained: I consulted with cardiologist Dr Okey who recommended: delta troponin, discharge with cardiology follow up if normal   Disposition: After consideration of the diagnostic results and the patients response to treatment, I feel that emergency department workup does not suggest an emergent condition requiring admission or immediate intervention beyond what has been performed at this time. Patient is to be discharged with recommendation to follow up with PCP in regards to today's hospital visit. Chest pain is not likely of cardiac  or pulmonary etiology d/t presentation, D-dimer for PE negative, VSS, no tracheal deviation, no JVD or new murmur, RRR, breath sounds equal bilaterally, EKG without acute abnormalities, negative troponin, and negative CXR. Heart score of 4. Pt has been advised to return to the ED if CP becomes exertional, associated with diaphoresis or nausea, radiates to left jaw/arm, worsens or becomes concerning in any way. Pt appears reliable for follow up and is agreeable to discharge.   I discussed this case with my attending physician Dr. Albertina who cosigned this note including patient's presenting symptoms, physical exam, and planned diagnostics and interventions. Attending physician stated agreement with plan or made changes to plan which were implemented.   Final Clinical Impression(s) / ED Diagnoses Final diagnoses:  Left-sided chest pain  Exertional chest pain    Rx / DC Orders ED Discharge Orders          Ordered    Ambulatory referral to Cardiology       Comments:  If you have not heard from the Cardiology office within the next 72 hours please call [phone].   09/22/23 1748           Portions of this report may have been transcribed using voice recognition software. Every effort was made to ensure accuracy; however, inadvertent computerized transcription errors may be present.    Churchill Grimsley T, PA-C 09/22/23 1749    Ula Prentice SAUNDERS, MD 09/22/23 2200

## 2023-10-14 DIAGNOSIS — J189 Pneumonia, unspecified organism: Secondary | ICD-10-CM | POA: Insufficient documentation

## 2023-10-14 DIAGNOSIS — R079 Chest pain, unspecified: Secondary | ICD-10-CM | POA: Insufficient documentation

## 2023-10-14 DIAGNOSIS — K219 Gastro-esophageal reflux disease without esophagitis: Secondary | ICD-10-CM | POA: Insufficient documentation

## 2023-10-14 DIAGNOSIS — Z9889 Other specified postprocedural states: Secondary | ICD-10-CM | POA: Insufficient documentation

## 2023-10-14 DIAGNOSIS — I10 Essential (primary) hypertension: Secondary | ICD-10-CM | POA: Insufficient documentation

## 2023-10-14 DIAGNOSIS — T8859XA Other complications of anesthesia, initial encounter: Secondary | ICD-10-CM | POA: Insufficient documentation

## 2023-10-15 ENCOUNTER — Ambulatory Visit: Payer: BC Managed Care – PPO

## 2023-10-15 VITALS — BP 114/74 | HR 75 | Ht 73.0 in | Wt 184.0 lb

## 2023-10-15 DIAGNOSIS — R002 Palpitations: Secondary | ICD-10-CM

## 2023-10-15 DIAGNOSIS — R931 Abnormal findings on diagnostic imaging of heart and coronary circulation: Secondary | ICD-10-CM | POA: Diagnosis not present

## 2023-10-15 DIAGNOSIS — I10 Essential (primary) hypertension: Secondary | ICD-10-CM

## 2023-10-15 DIAGNOSIS — R079 Chest pain, unspecified: Secondary | ICD-10-CM | POA: Diagnosis not present

## 2023-10-15 HISTORY — DX: Palpitations: R00.2

## 2023-10-15 MED ORDER — METOPROLOL TARTRATE 100 MG PO TABS: 100.0000 mg | ORAL_TABLET | Freq: Once | ORAL | 0 refills | Status: DC

## 2023-10-15 NOTE — Assessment & Plan Note (Signed)
Well-controlled at this time. Continue amlodipine 5 mg once daily.  Target below 130 over 80 mmHg.

## 2023-10-15 NOTE — Assessment & Plan Note (Signed)
 To be anginal equivalent based on his description does have risk factors in the form of age, smoking history and hypertension. Prior abnormal stress test with nuclear imaging from December 2008 Prior coronary angiogram from December 2008 normal.  Will further evaluate for any significant underlying obstructive coronary artery disease with CT coronary angiogram. Contrast use discussed and metoprolol  for heart rate optimization reviewed. Advised to avoid any moderate to heavy exertion and if symptoms are progressing to call 911 or get to the nearest ER.

## 2023-10-15 NOTE — Assessment & Plan Note (Signed)
 Prominent awareness of heartbeat intermittent. No loss of consciousness.  No obvious triggers.  Will obtain transthoracic echocardiogram for cardiac structure and function assessment and Zio patch for 14 days to rule out any significant underlying arrhythmias.

## 2023-10-15 NOTE — Progress Notes (Signed)
 Cardiology Consultation:    Date:  10/15/2023   ID:  Noah Liu, DOB 09-14-1970, MRN [id number]  PCP:  Pcp, No  Cardiologist:  Alean JONELLE Kobus, MD   Referring MD: Corie Friddie DASEN, PA-C   No chief complaint on file.    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  Problem List Items Addressed This Visit     Hypertension   Well-controlled at this time. Continue amlodipine  5 mg once daily.  Target below 130 over 80 mmHg.      Relevant Medications   amLODipine  (NORVASC ) 5 MG tablet   Exertional chest pain - Primary   To be anginal equivalent based on his description does have risk factors in the form of age, smoking history and hypertension. Prior abnormal stress test with nuclear imaging from December 2008 Prior coronary angiogram from December 2008 normal.  Will further evaluate for any significant underlying obstructive coronary artery disease with CT coronary angiogram. Contrast use discussed and metoprolol  for heart rate optimization reviewed. Advised to avoid any moderate to heavy exertion and if symptoms are progressing to call 911 or get to the nearest ER.         Relevant Orders   EKG 12-Lead (Completed)   CT CORONARY MORPH W/CTA COR W/SCORE W/CA W/CM &/OR WO/CM   Palpitations   Prominent awareness of heartbeat intermittent. No loss of consciousness.  No obvious triggers.  Will obtain transthoracic echocardiogram for cardiac  structure and function assessment and Zio patch for 14 days to rule out any significant underlying arrhythmias.       Relevant Orders   LONG TERM MONITOR (3-14 DAYS)   ECHOCARDIOGRAM COMPLETE   Return to clinic tentatively in 2 months.   History of Present Illness:    Noah Liu is a 53 y.o. male who is being seen today for the evaluation of chest pain at the request of Roemhildt, Lorin T, PA-C.  Pleasant gentleman here for the visit by himself.  Lives at home with his wife.  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  Currently not have a PCP and is in process of setting up.  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  More recently with chest discomfort he was evaluated in the emergency room 09-22-2023 hemodynamically stable.  Pain was felt to be noncardiac, high-sensitivity troponin I x 2 was less than 2.  D-dimer was unremarkable 0.31.  Chest x-ray was unremarkable.  Basic metabolic panel with BUN 16, creatinine 1.25, EGFR greater than 60.  CBC was unremarkable.  Here for further assessment. Describes symptoms of dyspnea and chest pressure with moderate exertion.  Continues to work and exercise regularly.  Notices the dyspnea with moderate exertion is more prominent recently.  Also describes symptoms of awareness of heartbeat,, describes it as more forceful and associated with dizziness.  Denies any syncopal episodes or falls.  Occurs randomly without any obvious trigger.  Can last for a minute or two.  Mild tinnitus on the left  ear associated with dizziness.  For blood pressure control he was recently started on amlodipine  5 mg after an urgent care visit few months ago has been taking it consistently and blood pressures have improved  EKG in the clinic today shows sinus rhythm heart rate 75/min, normal PR interval 160 ms, QRS duration 80 ms with borderline LVH criteria.  No significant change in comparison to prior EKG from 09-22-2023.  Last lipid panel available to review is from 05-20-2023 total cholesterol 158, triglycerides 96, HDL 48, LDL 91.  Last TSH to review is from May 20, 2023 normal 0.678. Past Medical History:  Diagnosis Date   Acute MI (HCC) 2007   Blind right eye 06/19/2018   Chronic daily headache 06/19/2018   Complication of anesthesia    Exertional chest pain    GERD (gastroesophageal reflux disease)    Hypertension    Left-sided chest pain    Pneumonia    PONV (postoperative nausea and vomiting)    Radiculopathy 10/11/2021   Referred otalgia of left ear 06/06/2018   Visual changes 06/19/2018    Past Surgical History:  Procedure Laterality Date   ABDOMINAL EXPOSURE N/A 10/11/2021   Procedure: ABDOMINAL EXPOSURE;  Surgeon: Magda Debby SAILOR, MD;  Location: [facility] OR;  Service: Vascular;  Laterality: N/A;   ANTERIOR LAT LUMBAR FUSION N/A 10/11/2021   Procedure: LUMBAR FIVE - SACRUM ONE ANTERIOR LUMBAR INTERBODY FUSION WITH POSTERIOR SPINAL FUSION WITH INSTRUMENTATION AND ALLOGRAFT;  Surgeon: Beuford Anes, MD;  Location: [facility] OR;  Service: Orthopedics;  Laterality: N/A;   CARDIAC CATHETERIZATION  09/08/2007   ESOPHAGOGASTRODUODENOSCOPY     EYE SURGERY     multiple    Current Medications: Current Meds  Medication Sig   amLODipine  (NORVASC ) 5 MG tablet Take 5 mg by mouth daily.     Allergies:   Aspirin    Social History   Socioeconomic History   Marital status: Married    Spouse name: Eesa Justiss   Number of children: 5   Years of education: Not on file   Highest education  level: Not on file  Occupational History   Not on file  Tobacco Use   Smoking status: Some Days    Types: E-cigarettes   Smokeless tobacco: Never  Vaping Use   Vaping status: Some Days  Substance and Sexual Activity   Alcohol use: No   Drug use: No   Sexual activity: Yes  Other Topics Concern   Not on file  Social History Narrative   Not on file   Social Drivers of Health   Financial Resource Strain: Low Risk  (05/18/2021)   Received from [facility]   Overall Financial Resource Strain (CARDIA)    Difficulty of Paying Living Expenses: Not hard at all  Food Insecurity: Low Risk  (05/19/2023)   Received from [facility]   Hunger Vital Sign    Worried About Running Out of Food in the Last Year: Never true    Ran Out of Food in the Last Year: Never true  Transportation Needs: No Transportation Needs (05/19/2023)   Received from [facility]    In the past 12 months, has lack  of reliable transportation kept you from medical appointments, meetings, work or from getting things needed for daily living? : No  Physical Activity: Inactive (05/18/2021)   Received from [facility]   Exercise Vital Sign    Days of Exercise per Week: 0 days    Minutes of Exercise per Session: 30 min  Stress: No Stress Concern Present (05/18/2021)   Received from [facility] - Occupational Stress Questionnaire    Feeling of Stress : Not at all  Social Connections: Unknown (01/21/2022)   Received from [facility]   Social Network    Social Network: Not on file     Family History: The patient's family history includes Heart attack in his father; Hypertension in his father, mother, and sister; Von Willebrand disease in his father. ROS:   Please see the history of present illness.    All 14 point review of systems negative except as described per history of present illness.  EKGs/Labs/Other Studies Reviewed:    The following studies were  reviewed today:   EKG:  EKG Interpretation Date/Time:  Tuesday October 15 2023 14:50:36 EST Ventricular Rate:  75 PR Interval:  160 QRS Duration:  80 QT Interval:  346 QTC Calculation: 386 R Axis:   60  Text Interpretation: Normal sinus rhythm Normal ECG When compared with ECG of 22-Sep-2023 13:53, No significant change was found Confirmed by Liborio Hai reddy [phone]) on 10/15/2023 3:12:44 PM    Recent Labs: 09/22/2023: BUN 16; Creatinine, Ser 1.25; Hemoglobin 13.1; Platelets 213; Potassium 3.9; Sodium 135  Recent Lipid Panel No results found for: CHOL, TRIG, HDL, CHOLHDL, VLDL, LDLCALC, LDLDIRECT  Physical Exam:    VS:  BP 114/74   Pulse 75   Ht 6' 1 (1.854 m)   Wt 184 lb (83.5 kg)   SpO2 99%   BMI 24.28 kg/m     Wt Readings from Last 3 Encounters:  10/15/23 184 lb (83.5 kg)  09/22/23 185 lb (83.9 kg)  02/01/22 180 lb (81.6 kg)     GENERAL:  Well nourished, well developed in no acute distress Lack of vision in right eye. NECK: No JVD; No carotid bruits CARDIAC: RRR, S1 and S2 present, no murmurs, no rubs, no gallops CHEST:  Clear to auscultation without rales, wheezing or rhonchi  Extremities: No pitting pedal edema. Pulses bilaterally symmetric with radial 2+ and dorsalis pedis 2+ NEUROLOGIC:  Alert and oriented x 3  Medication Adjustments/Labs and Tests Ordered: Current medicines are reviewed at length with the patient today.  Concerns regarding medicines are outlined above.  Orders Placed This Encounter  Procedures   CT CORONARY MORPH W/CTA COR W/SCORE W/CA W/CM &/OR WO/CM   LONG TERM MONITOR (3-14 DAYS)   EKG 12-Lead   ECHOCARDIOGRAM COMPLETE   No orders of the defined types were placed in this encounter.   Signed, Hai jess Liborio, MD, MPH, [facility]. 10/15/2023 3:41 PM    [facility]

## 2023-10-15 NOTE — Patient Instructions (Signed)
 Medication Instructions:   Take Metoprolol  100 mg two hours before your CT   *If you need a refill on your cardiac medications before your next appointment, please call your pharmacy*   Lab Work: None  If you have labs (blood work) drawn today and your tests are completely normal, you will receive your results only by: MyChart Message (if you have MyChart) OR A paper copy in the mail If you have any lab test that is abnormal or we need to change your treatment, we will call you to review the results.   Testing/Procedures:  Echocardiogram An echocardiogram is a test that uses sound waves (ultrasound) to produce images of the heart. Images from an echocardiogram can provide important information about: Heart size and shape. The size and thickness and movement of your heart's walls. Heart muscle function and strength. Heart valve function or if you have stenosis. Stenosis is when the heart valves are too narrow. If blood is flowing backward through the heart valves (regurgitation). A tumor or infectious growth around the heart valves. Areas of heart muscle that are not working well because of poor blood flow or injury from a heart attack. Aneurysm detection. An aneurysm is a weak or damaged part of an artery wall. The wall bulges out from the normal force of blood pumping through the body. Tell a health care provider about: Any allergies you have. All medicines you are taking, including vitamins, herbs, eye drops, creams, and over-the-counter medicines. Any blood disorders you have. Any surgeries you have had. Any medical conditions you have. Whether you are pregnant or may be pregnant. What are the risks? Generally, this is a safe test. However, problems may occur, including an allergic reaction to dye (contrast) that may be used during the test. What happens before the test? No specific preparation is needed. You may eat and drink normally. What happens during the test?  You  will take off your clothes from the waist up and put on a hospital gown. Electrodes or electrocardiogram (ECG)patches may be placed on your chest. The electrodes or patches are then connected to a device that monitors your heart rate and rhythm. You will lie down on a table for an ultrasound exam. A gel will be applied to your chest to help sound waves pass through your skin. A handheld device, called a transducer, will be pressed against your chest and moved over your heart. The transducer produces sound waves that travel to your heart and bounce back (or echo back) to the transducer. These sound waves will be captured in real-time and changed into images of your heart that can be viewed on a video monitor. The images will be recorded on a computer and reviewed by your health care provider. You may be asked to change positions or hold your breath for a short time. This makes it easier to get different views or better views of your heart. In some cases, you may receive contrast through an IV in one of your veins. This can improve the quality of the pictures from your heart. The procedure may vary among health care providers and hospitals. What can I expect after the test? You may return to your normal, everyday life, including diet, activities, and medicines, unless your health care provider tells you not to do that. Follow these instructions at home: It is up to you to get the results of your test. Ask your health care provider, or the department that is doing the test, when your results  will be ready. Keep all follow-up visits. This is important. Summary An echocardiogram is a test that uses sound waves (ultrasound) to produce images of the heart. Images from an echocardiogram can provide important information about the size and shape of your heart, heart muscle function, heart valve function, and other possible heart problems. You do not need to do anything to prepare before this test. You may  eat and drink normally. After the echocardiogram is completed, you may return to your normal, everyday life, unless your health care provider tells you not to do that. This information is not intended to replace advice given to you by your health care provider. Make sure you discuss any questions you have with your health care provider. Document Revised: 05/10/2021 Document Reviewed: 04/19/2020 Elsevier Patient Education  2023 Elsevier Inc.    A zio monitor was ordered today. It will remain on for 14 days. Remove 10/29/2023. You will then return monitor and event diary in provided box. It takes 1-2 weeks for report to be downloaded and returned to us . We will call you with the results. If monitor falls off or has orange flashing light, please call Zio for further instructions.   On the Night Before the Test: Be sure to Drink plenty of water. Do not consume any caffeinated/decaffeinated beverages or chocolate 12 hours prior to your test. Do not take any antihistamines 12 hours prior to your test.  On the Day of the Test: Drink plenty of water until 1 hour prior to the test. Do not eat any food 4 hours prior to the test. You may take your regular medications prior to the test.  Take metoprolol  (Lopressor ) two hours prior to test.        After the Test: Drink plenty of water. After receiving IV contrast, you may experience a mild flushed feeling. This is normal. On occasion, you may experience a mild rash up to 24 hours after the test. This is not dangerous. If this occurs, you can take Benadryl  25 mg and increase your fluid intake. If you experience trouble breathing, this can be serious. If it is severe call 911 IMMEDIATELY. If it is mild, please call our office. If you take any of these medications: Glipizide/Metformin, Avandament, Glucavance, please do not take 48 hours after completing test unless otherwise instructed.  We will call to schedule your test 2-4 weeks out understanding  that some insurance companies will need an authorization prior to the service being performed.   For non-scheduling related questions, please contact the cardiac imaging nurse navigator should you have any questions/concerns: Camie Shutter, Cardiac Imaging Nurse Navigator Chantal Requena, Cardiac Imaging Nurse Navigator [facility] Direct Office Dial: [phone]   For scheduling needs, including cancellations and rescheduling, please call Brittany, [phone].    Follow-Up: At [facility], you and your health needs are our priority.  As part of our continuing mission to provide you with exceptional heart care, we have created designated Provider Care Teams.  These Care Teams include your primary Cardiologist (physician) and Advanced Practice Providers (APPs -  Physician Assistants and Nurse Practitioners) who all work together to provide you with the care you need, when you need it.  We recommend signing up for the patient portal called MyChart.  Sign up information is provided on this After Visit Summary.  MyChart is used to connect with patients for Virtual Visits (Telemedicine).  Patients are able to view lab/test results, encounter notes, upcoming appointments, etc.  Non-urgent  messages can be sent to your provider as well.   To learn more about what you can do with MyChart, go to [email or web address].    Your next appointment:   2 month    Other Instructions Cardiac CT Angiogram A cardiac CT angiogram is a procedure to look at the heart and the area around the heart. It may be done to help find the cause of chest pains or other symptoms of heart disease. During this procedure, a substance called contrast dye is injected into the blood vessels in the area to be checked. A large X-ray machine, called a CT scanner, then takes detailed pictures of the heart and the surrounding area. The procedure is also sometimes called a coronary CT angiogram, coronary  artery scanning, or CTA. A cardiac CT angiogram allows the health care provider to see how well blood is flowing to and from the heart. The health care provider will be able to see if there are any problems, such as: Blockage or narrowing of the coronary arteries in the heart. Fluid around the heart. Signs of weakness or disease in the muscles, valves, and tissues of the heart. Tell a health care provider about: Any allergies you have. This is especially important if you have had a previous allergic reaction to contrast dye. All medicines you are taking, including vitamins, herbs, eye drops, creams, and over-the-counter medicines. Any blood disorders you have. Any surgeries you have had. Any medical conditions you have. Whether you are pregnant or may be pregnant. Any anxiety disorders, chronic pain, or other conditions you have that may increase your stress or prevent you from lying still. What are the risks? Generally, this is a safe procedure. However, problems may occur, including: Bleeding. Infection. Allergic reactions to medicines or dyes. Damage to other structures or organs. Kidney damage from the contrast dye that is used. Increased risk of cancer from radiation exposure. This risk is low. Talk with your health care provider about: The risks and benefits of testing. How you can receive the lowest dose of radiation. What happens before the procedure? Wear comfortable clothing and remove any jewelry, glasses, dentures, and hearing aids. Follow instructions from your health care provider about eating and drinking. This may include: For 12 hours before the procedure -- avoid caffeine. This includes tea, coffee, soda, energy drinks, and diet pills. Drink plenty of water or other fluids that do not have caffeine in them. Being well hydrated can prevent complications. For 4-6 hours before the procedure -- stop eating and drinking. The contrast dye can cause nausea, but this is less  likely if your stomach is empty. Ask your health care provider about changing or stopping your regular medicines. This is especially important if you are taking diabetes medicines, blood thinners, or medicines to treat problems with erections (erectile dysfunction). What happens during the procedure?  Hair on your chest may need to be removed so that small sticky patches called electrodes can be placed on your chest. These will transmit information that helps to monitor your heart during the procedure. An IV will be inserted into one of your veins. You might be given a medicine to control your heart rate during the procedure. This will help to ensure that good images are obtained. You will be asked to lie on an exam table. This table will slide in and out of the CT machine during the procedure. Contrast dye will be injected into the IV. You might feel warm, or you may get a metallic  taste in your mouth. You will be given a medicine called nitroglycerin . This will relax or dilate the arteries in your heart. The table that you are lying on will move into the CT machine tunnel for the scan. The person running the machine will give you instructions while the scans are being done. You may be asked to: Keep your arms above your head. Hold your breath. Stay very still, even if the table is moving. When the scanning is complete, you will be moved out of the machine. The IV will be removed. The procedure may vary among health care providers and hospitals. What can I expect after the procedure? After your procedure, it is common to have: A metallic taste in your mouth from the contrast dye. A feeling of warmth. A headache from the nitroglycerin . Follow these instructions at home: Take over-the-counter and prescription medicines only as told by your health care provider. If you are told, drink enough fluid to keep your urine pale yellow. This will help to flush the contrast dye out of your body. Most  people can return to their normal activities right after the procedure. Ask your health care provider what activities are safe for you. It is up to you to get the results of your procedure. Ask your health care provider, or the department that is doing the procedure, when your results will be ready. Keep all follow-up visits as told by your health care provider. This is important. Contact a health care provider if: You have any symptoms of allergy to the contrast dye. These include: Shortness of breath. Rash or hives. A racing heartbeat. Summary A cardiac CT angiogram is a procedure to look at the heart and the area around the heart. It may be done to help find the cause of chest pains or other symptoms of heart disease. During this procedure, a large X-ray machine, called a CT scanner, takes detailed pictures of the heart and the surrounding area after a contrast dye has been injected into blood vessels in the area. Ask your health care provider about changing or stopping your regular medicines before the procedure. This is especially important if you are taking diabetes medicines, blood thinners, or medicines to treat erectile dysfunction. If you are told, drink enough fluid to keep your urine pale yellow. This will help to flush the contrast dye out of your body. This information is not intended to replace advice given to you by your health care provider. Make sure you discuss any questions you have with your health care provider. Document Revised: 04/22/2019 Document Reviewed: 04/22/2019 Elsevier Patient Education  2020 [employer].

## 2023-10-17 ENCOUNTER — Other Ambulatory Visit: Payer: Self-pay

## 2023-10-17 DIAGNOSIS — R002 Palpitations: Secondary | ICD-10-CM

## 2023-10-17 DIAGNOSIS — R079 Chest pain, unspecified: Secondary | ICD-10-CM

## 2023-10-17 DIAGNOSIS — I10 Essential (primary) hypertension: Secondary | ICD-10-CM

## 2023-10-23 ENCOUNTER — Other Ambulatory Visit: Payer: Self-pay

## 2023-11-05 ENCOUNTER — Ambulatory Visit (HOSPITAL_BASED_OUTPATIENT_CLINIC_OR_DEPARTMENT_OTHER)
Admission: RE | Admit: 2023-11-05 | Discharge: 2023-11-05 | Disposition: A | Discharge status: Home / Self Care | Payer: No Typology Code available for payment source | Source: Ambulatory Visit

## 2023-11-05 DIAGNOSIS — R079 Chest pain, unspecified: Secondary | ICD-10-CM | POA: Diagnosis present

## 2023-11-05 LAB — ECHOCARDIOGRAM COMPLETE
AR max vel: 2.92 cm2
AV Area VTI: 2.99 cm2
AV Area mean vel: 3.05 cm2
AV Mean grad: 5 mmHg
AV Peak grad: 9.5 mmHg
Ao pk vel: 1.54 m/s
Area-P 1/2: 3.48 cm2
Calc EF: 66.5 %
S' Lateral: 3 cm
Single Plane A2C EF: 66.3 %
Single Plane A4C EF: 66.4 %

## 2023-11-08 ENCOUNTER — Encounter (HOSPITAL_COMMUNITY): Payer: Self-pay

## 2023-11-11 ENCOUNTER — Telehealth (HOSPITAL_COMMUNITY): Payer: Self-pay | Admitting: *Deleted

## 2023-11-11 NOTE — Telephone Encounter (Signed)
 Attempted to call patient regarding upcoming cardiac CT appointment. Left message on voicemail with name and callback number Johney Frame RN Navigator Cardiac Imaging [facility] [phone] Office

## 2023-11-12 ENCOUNTER — Ambulatory Visit (HOSPITAL_BASED_OUTPATIENT_CLINIC_OR_DEPARTMENT_OTHER)
Admission: RE | Admit: 2023-11-12 | Discharge: 2023-11-12 | Disposition: A | Discharge status: Home / Self Care | Payer: No Typology Code available for payment source | Source: Ambulatory Visit

## 2023-11-12 ENCOUNTER — Encounter (HOSPITAL_BASED_OUTPATIENT_CLINIC_OR_DEPARTMENT_OTHER): Payer: Self-pay

## 2023-11-12 DIAGNOSIS — R079 Chest pain, unspecified: Secondary | ICD-10-CM | POA: Diagnosis present

## 2023-11-12 MED ORDER — IOHEXOL 350 MG/ML SOLN
100.0000 mL | Freq: Once | INTRAVENOUS | Status: AC | PRN
  Administered 2023-11-12: 95 mL via INTRAVENOUS

## 2023-11-12 MED ORDER — DILTIAZEM HCL 25 MG/5ML IV SOLN: 10.0000 mg | INTRAVENOUS | Status: DC | PRN

## 2023-11-12 MED ORDER — METOPROLOL TARTRATE 5 MG/5ML IV SOLN: 10.0000 mg | Freq: Once | INTRAVENOUS | Status: DC | PRN

## 2023-11-12 MED ORDER — NITROGLYCERIN 0.4 MG SL SUBL
0.8000 mg | SUBLINGUAL_TABLET | Freq: Once | SUBLINGUAL | Status: AC
  Administered 2023-11-12: 0.8 mg via SUBLINGUAL

## 2023-11-12 NOTE — Progress Notes (Signed)
 Patient presents for a cardiac CT and tolerated procedure without incident. Patient maintained acceptable vital signs, denies symptoms.  Patient ambulated out of department with a steady gait.

## 2023-11-16 ENCOUNTER — Ambulatory Visit (HOSPITAL_BASED_OUTPATIENT_CLINIC_OR_DEPARTMENT_OTHER)
Admission: RE | Admit: 2023-11-16 | Discharge: 2023-11-16 | Disposition: A | Discharge status: Home / Self Care | Source: Ambulatory Visit

## 2023-11-16 DIAGNOSIS — R079 Chest pain, unspecified: Secondary | ICD-10-CM | POA: Diagnosis not present

## 2023-11-16 DIAGNOSIS — R931 Abnormal findings on diagnostic imaging of heart and coronary circulation: Secondary | ICD-10-CM | POA: Insufficient documentation

## 2023-11-16 DIAGNOSIS — I2510 Atherosclerotic heart disease of native coronary artery without angina pectoris: Secondary | ICD-10-CM | POA: Insufficient documentation

## 2023-11-16 DIAGNOSIS — R002 Palpitations: Secondary | ICD-10-CM

## 2023-11-16 HISTORY — DX: Atherosclerotic heart disease of native coronary artery without angina pectoris: I25.10

## 2023-11-16 NOTE — Addendum Note (Signed)
 Addended by: Huntley Dec REDDY on: 11/16/2023 09:09 PM   Modules accepted: Orders

## 2023-11-18 ENCOUNTER — Telehealth: Payer: Self-pay | Admitting: Emergency Medicine

## 2023-11-18 DIAGNOSIS — R931 Abnormal findings on diagnostic imaging of heart and coronary circulation: Secondary | ICD-10-CM

## 2023-11-18 NOTE — Telephone Encounter (Signed)
-----   Message from Latimer R Madireddy sent at 11/16/2023 10:59 PM EST ----- Sent a message to him about his echocardiogram, event monitor, cardiac CT results.  Based on his cardiac CT results showing coronary atherosclerosis moderate stenosis, I recommended he should be started on statins if agreeable, Crestor 10 mg once daily, will require CMP 1 month after starting the medication.  Also recommended starting aspirin 81 mg once daily.  Please send these prescriptions out if he is agreeable.  Please call him early next week to review these results and recommendations over the phone.  Thank you

## 2023-11-18 NOTE — Telephone Encounter (Signed)
 Left voicemail for patient to return call.

## 2023-11-19 MED ORDER — ASPIRIN 81 MG PO TBEC: 81.0000 mg | DELAYED_RELEASE_TABLET | Freq: Every day | ORAL | Status: AC

## 2023-11-19 MED ORDER — ROSUVASTATIN CALCIUM 10 MG PO TABS: 10.0000 mg | ORAL_TABLET | Freq: Every day | ORAL | 3 refills | Status: AC

## 2023-11-19 MED ORDER — ROSUVASTATIN CALCIUM 10 MG PO TABS
10.0000 mg | ORAL_TABLET | Freq: Every day | ORAL | 3 refills | Status: DC
Start: 2023-11-19 — End: 2023-11-19

## 2023-11-19 MED ORDER — ASPIRIN 81 MG PO TBEC: 81.0000 mg | DELAYED_RELEASE_TABLET | Freq: Every day | ORAL | Status: DC

## 2023-11-19 NOTE — Telephone Encounter (Signed)
 Called and spoke to patient and reviewed Cardiac CT results with patient as per DR. Madireddy's note. Reviewed Dr. Anna Genre recommendation to start Crestor 10 mg once a day, and start Aspirin 81 mg once a day. Patient agreeable with starting both medications. Patient aware he needs repeat labs in 4 weeks. Reviewed echo results and monitor results with patient as well per DR. Madireddy result note for patients echo and monitor. Patient verbalized understanding and had no additional questions. Rx sent to pharmacy.

## 2023-11-19 NOTE — Addendum Note (Signed)
 Addended by: Lonia Farber on: 11/19/2023 09:21 AM   Modules accepted: Orders

## 2023-11-19 NOTE — Addendum Note (Signed)
 Addended by: Lonia Farber on: 11/19/2023 09:37 AM   Modules accepted: Orders

## 2023-12-16 ENCOUNTER — Ambulatory Visit: Payer: No Typology Code available for payment source

## 2023-12-16 VITALS — BP 126/84 | HR 72 | Ht 73.0 in | Wt 195.0 lb

## 2023-12-16 DIAGNOSIS — R42 Dizziness and giddiness: Secondary | ICD-10-CM | POA: Diagnosis not present

## 2023-12-16 DIAGNOSIS — E785 Hyperlipidemia, unspecified: Secondary | ICD-10-CM | POA: Diagnosis not present

## 2023-12-16 DIAGNOSIS — I2510 Atherosclerotic heart disease of native coronary artery without angina pectoris: Secondary | ICD-10-CM | POA: Diagnosis not present

## 2023-12-16 DIAGNOSIS — I10 Essential (primary) hypertension: Secondary | ICD-10-CM | POA: Diagnosis not present

## 2023-12-16 MED ORDER — AMLODIPINE BESYLATE 5 MG PO TABS: 5.0000 mg | ORAL_TABLET | Freq: Every day | ORAL | 3 refills | Status: AC

## 2023-12-16 NOTE — Progress Notes (Signed)
 Cardiology Consultation:    Date:  12/16/2023   ID:  Noah Liu, DOB May 04, 1971, MRN [id number]  PCP:  Pcp, No  Cardiologist:  Luretha Murphy, MD   Referring MD: No ref. provider found   No chief complaint on file.    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 Target below 130/80 mmHg. Continue amlodipine 5 mg once daily.  Medication renewal sent.      Relevant Medications   amLODipine (NORVASC) 5 MG tablet   Coronary atherosclerosis, CAD RADS 3 study on cardiac CT 11-12-2023.  Not significant by CT FFR - Primary   Prior abnormal stress test with nuclear imaging from December 2008 Prior coronary angiogram from December 2008 normal Cardiac CT 11-12-2023: Calcium score 7.75, total plaque volume 109 mm cube, CAD RADS 3 study 50 to 69% stenosis of proximal to mid RCA, not hemodynamically significant by CT FFR.  Tiny PFO.  No significant extracardiac findings.  Reviewed the results from his cardiac CT with regards to the plaque.  In the setting continue with aspirin 81 mg once daily so long as he is tolerating it well and has no side effects. Continue with statin therapy with Crestor as below discussed under dyslipidemia.       Relevant Medications   amLODipine (NORVASC) 5 MG tablet   Dyslipidemia   Last lipid panel available to review is from 05-20-2023 total cholesterol 158, triglycerides 96, HDL  48, LDL 91. In the setting of coronary atherosclerosis noted as above, target LDL below 70. Crestor 10 mg was started earlier in March after his cardiac CT. Tolerating well. He is due for a CMP to be completed to monitor LFTs. Continue the same dose of Crestor 10 mg once daily for now.      Lightheadedness   Intermittent and appears postural. Continue with his current medications and adjusted strongly to keep himself well-hydrated and take precautions changing positions suddenly.       Return to clinic tentatively in 1 year.  Earlier follow-up as needed.  History of Present Illness:    Noah Liu is a 53 y.o. male who is being seen today for follow-up visit.    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  With symptoms of exertional chest pain he reported coronary CT angiogram was requested.  This was completed 11-12-2023 and noted calcium score of 7.75, total plaque volume 109 mm cube, CAD RADS 3 lesion in proximal to mid RCA, CT FFR of this lesion showed low likelihood of hemodynamically significant stenosis.  Heart monitor for 14 days from 10-15-2023 noted predominantly sinus rhythm average heart rate 85/min [ranging  from 54 to 148 bpm].  Rare ventricular and supraventricular ectopy burden, less than 1%.  2 short runs of SVT with the longest episode lasting 6 beats and were asymptomatic.  1 instance of patient triggered event recorded and this correlated with sinus tachycardia at 104 bpm.  Overall reassuring results.  Echocardiogram from 11-05-2023 noted normal biventricular  function, LVEF 60 to 65% and no significant valve abnormalities.  Mentions overall doing well.  Continues to work.  No significant limitations.  Fussing in the morning for an hour or two he does report symptoms of dizziness/lightheadedness noticeably when he is changing position.  Denies any syncope or falls.  Nothing that keeps him from keeping up with his day-to-day activities and exertion.  Taking his medications well.  Tolerating Crestor that was started about a month ago after the CT coronary angiogram results.   Last lipid panel available to review is from 05-20-2023 total cholesterol 158, triglycerides 96, HDL 48, LDL 91.  Past Medical History:  Diagnosis Date   Acute MI (HCC) 2007   Blind right eye 06/19/2018   Chronic daily headache 06/19/2018   Complication of anesthesia    Coronary atherosclerosis, CAD RADS 3 study on cardiac CT 11-12-2023.  Not significant by CT FFR 11/16/2023   Cardiac CT 11-12-2023: Calcium score 7.75, total plaque volume 109 mm cube, CAD RADS 3 study 50 to 69% stenosis of proximal to mid RCA, not hemodynamically significant by CT FFR.  Tiny PFO.     Exertional chest pain    GERD (gastroesophageal reflux disease)    Hypertension    Left-sided chest pain    Palpitations 10/15/2023   Pneumonia    PONV (postoperative nausea and vomiting)    Radiculopathy 10/11/2021   Referred otalgia of left ear 06/06/2018   Visual changes 06/19/2018    Past Surgical History:  Procedure Laterality Date   ABDOMINAL EXPOSURE N/A 10/11/2021   Procedure: ABDOMINAL EXPOSURE;  Surgeon: Leonie Douglas, MD;  Location: [facility] OR;  Service: Vascular;  Laterality: N/A;   ANTERIOR LAT LUMBAR FUSION N/A 10/11/2021   Procedure: LUMBAR FIVE - SACRUM ONE ANTERIOR LUMBAR INTERBODY FUSION WITH POSTERIOR SPINAL FUSION WITH INSTRUMENTATION AND ALLOGRAFT;  Surgeon: Estill Bamberg, MD;  Location: [facility] OR;  Service: Orthopedics;  Laterality: N/A;   CARDIAC CATHETERIZATION  09/08/2007    ESOPHAGOGASTRODUODENOSCOPY     EYE SURGERY     multiple    Current Medications: Current Meds  Medication Sig   aspirin EC 81 MG tablet Take 1 tablet (81 mg total) by mouth daily. Swallow whole.   rosuvastatin (CRESTOR) 10 MG tablet Take 1 tablet (10 mg total) by mouth daily.   [DISCONTINUED] amLODipine (NORVASC) 5 MG tablet Take 5 mg by mouth daily.     Allergies:   Aspirin   Social History   Socioeconomic History   Marital status: Married    Spouse name: Mathhew Buysse   Number of children: 5   Years of education: Not on file   Highest education level: Not on file  Occupational History   Not on file  Tobacco Use   Smoking status: Some Days    Types: E-cigarettes   Smokeless tobacco: Never  Vaping Use   Vaping status: Some Days  Substance and Sexual Activity   Alcohol use: No   Drug use: No   Sexual activity: Yes  Other Topics Concern   Not on file  Social History Narrative   Not on file   Social Drivers of Health  Financial Resource Strain: Low Risk  (05/18/2021)   Received from [facility]   Overall Financial Resource Strain (CARDIA)    Difficulty of Paying Living Expenses: Not hard at all  Food Insecurity: Low Risk  (05/19/2023)   Received from [facility]   Hunger Vital Sign    Worried About Running Out of Food in the Last Year: Never true    Ran Out of Food in the Last Year: Never true  Transportation Needs: No Transportation Needs (05/19/2023)   Received from [facility]    In the past 12 months, has lack of reliable transportation kept you from medical appointments, meetings, work or from getting things needed for daily living? : No  Physical Activity: Inactive (05/18/2021)   Received from [facility]   Exercise Vital Sign    Days of Exercise per Week: 0 days    Minutes of Exercise per Session: 30 min  Stress: No Stress Concern Present (05/18/2021)   Received from [facility] -  Occupational Stress Questionnaire    Feeling of Stress : Not at all  Social Connections: Unknown (01/21/2022)   Received from [facility]   Social Network    Social Network: Not on file     Family History: The patient's family history includes Heart attack in his father; Hypertension in his father, mother, and sister; Von Willebrand disease in his father. ROS:   Please see the history of present illness.    All 14 point review of systems negative except as described per history of present illness.  EKGs/Labs/Other Studies Reviewed:    The following studies were reviewed today:   EKG:       Recent Labs: 09/22/2023: BUN 16; Creatinine, Ser 1.25; Hemoglobin 13.1; Platelets 213; Potassium 3.9; Sodium 135  Recent Lipid Panel No results found for: "CHOL", "TRIG", "HDL", "CHOLHDL", "VLDL", "LDLCALC", "LDLDIRECT"  Physical Exam:    VS:  BP 126/84   Pulse 72   Ht 6\' 1"  (1.854 m)   Wt 195 lb (88.5 kg)   SpO2 99%   BMI 25.73 kg/m     Wt Readings from Last 3 Encounters:  12/16/23 195 lb (88.5 kg)  10/15/23 184 lb (83.5 kg)  09/22/23 185 lb (83.9 kg)     GENERAL:  Well nourished, well developed in no acute distress NECK: No JVD; No carotid bruits CARDIAC: RRR, S1 and S2 present, no murmurs, no rubs, no gallops CHEST:  Clear to auscultation without rales, wheezing or rhonchi  Extremities: No pitting pedal edema. Pulses bilaterally symmetric with radial 2+ and dorsalis pedis 2+ NEUROLOGIC:  Alert and oriented x 3  Medication Adjustments/Labs and Tests Ordered: Current medicines are reviewed at length with the patient today.  Concerns regarding medicines are outlined above.  No orders of the defined types were placed in this encounter.  Meds ordered this encounter  Medications   amLODipine (NORVASC) 5 MG tablet    Sig: Take 1 tablet (5 mg total) by mouth daily.    Dispense:  90 tablet    Refill:  3    Signed, Jezabella Schriever reddy Jaydence Arnesen, MD, MPH, [facility]. 12/16/2023 10:47 AM     [facility]

## 2023-12-16 NOTE — Assessment & Plan Note (Signed)
 Intermittent and appears postural. Continue with his current medications and adjusted strongly to keep himself well-hydrated and take precautions changing positions suddenly.

## 2023-12-16 NOTE — Assessment & Plan Note (Signed)
 Last lipid panel available to review is from 05-20-2023 total cholesterol 158, triglycerides 96, HDL 48, LDL 91. In the setting of coronary atherosclerosis noted as above, target LDL below 70. Crestor 10 mg was started earlier in March after his cardiac CT. Tolerating well. He is due for a CMP to be completed to monitor LFTs. Continue the same dose of Crestor 10 mg once daily for now.

## 2023-12-16 NOTE — Assessment & Plan Note (Signed)
 Prior abnormal stress test with nuclear imaging from December 2008 Prior coronary angiogram from December 2008 normal Cardiac CT 11-12-2023: Calcium score 7.75, total plaque volume 109 mm cube, CAD RADS 3 study 50 to 69% stenosis of proximal to mid RCA, not hemodynamically significant by CT FFR.  Tiny PFO.  No significant extracardiac findings.  Reviewed the results from his cardiac CT with regards to the plaque.  In the setting continue with aspirin 81 mg once daily so long as he is tolerating it well and has no side effects. Continue with statin therapy with Crestor as below discussed under dyslipidemia.

## 2023-12-16 NOTE — Patient Instructions (Signed)

## 2023-12-16 NOTE — Assessment & Plan Note (Addendum)
 Well-controlled. Target below 130/80 mmHg. Continue amlodipine 5 mg once daily.  Medication renewal sent.

## 2024-04-30 ENCOUNTER — Emergency Department (HOSPITAL_BASED_OUTPATIENT_CLINIC_OR_DEPARTMENT_OTHER)
Admission: EM | Admit: 2024-04-30 | Discharge: 2024-04-30 | Disposition: A | Discharge status: Home / Self Care | Attending: Emergency Medicine | Admitting: Emergency Medicine

## 2024-04-30 ENCOUNTER — Other Ambulatory Visit: Payer: Self-pay

## 2024-04-30 ENCOUNTER — Encounter (HOSPITAL_BASED_OUTPATIENT_CLINIC_OR_DEPARTMENT_OTHER): Payer: Self-pay

## 2024-04-30 DIAGNOSIS — R519 Headache, unspecified: Secondary | ICD-10-CM | POA: Insufficient documentation

## 2024-04-30 DIAGNOSIS — Z79899 Other long term (current) drug therapy: Secondary | ICD-10-CM | POA: Insufficient documentation

## 2024-04-30 DIAGNOSIS — H9202 Otalgia, left ear: Secondary | ICD-10-CM | POA: Insufficient documentation

## 2024-04-30 DIAGNOSIS — I10 Essential (primary) hypertension: Secondary | ICD-10-CM | POA: Insufficient documentation

## 2024-04-30 DIAGNOSIS — Z7982 Long term (current) use of aspirin: Secondary | ICD-10-CM | POA: Insufficient documentation

## 2024-04-30 MED ORDER — NEOMYCIN-POLYMYXIN-HC 3.5-10000-1 OT SUSP: 4.0000 [drp] | Freq: Three times a day (TID) | OTIC | 0 refills | Status: AC

## 2024-04-30 MED ORDER — PSEUDOEPHEDRINE HCL ER 120 MG PO TB12: 120.0000 mg | ORAL_TABLET | Freq: Two times a day (BID) | ORAL | 0 refills | Status: AC

## 2024-04-30 NOTE — ED Triage Notes (Signed)
 Pt states he began having an earache on Tuesday,  C/o HA, fullness and dizziness

## 2024-04-30 NOTE — ED Provider Notes (Signed)
 [place] [facility] Provider Note   CSN: [id number] Arrival date & time: 04/30/24  2014     Patient presents with: Noah Liu is a 53 y.o. male.    Otalgia    Patient has history of acute MI hypertension headaches.  Patient presents ED with complaints of ear discomfort.  Patient states he started having symptoms on Tuesday.  He has had some headache and fullness in his left ear.  He also has noticed some decreased hearing.  He has not had any fevers or chills.  No runny nose.  No sinus congestion.  No toothache.  Prior to Admission medications   Medication Sig Start Date End Date Taking? Authorizing Provider  neomycin -polymyxin-hydrocortisone (CORTISPORIN) 3.5-10000-1 OTIC suspension Place 4 drops into the left ear 3 (three) times daily. 04/30/24  Yes Randol Simmonds, MD  pseudoephedrine  (SUDAFED SINUS CONGESTION 12HR) 120 MG 12 hr tablet Take 1 tablet (120 mg total) by mouth 2 (two) times daily. 04/30/24  Yes Randol Simmonds, MD  amLODipine  (NORVASC ) 5 MG tablet Take 1 tablet (5 mg total) by mouth daily. 12/16/23   Madireddy, Alean SAUNDERS, MD  aspirin  EC 81 MG tablet Take 1 tablet (81 mg total) by mouth daily. Swallow whole. 11/19/23   Madireddy, Alean SAUNDERS, MD  rosuvastatin  (CRESTOR ) 10 MG tablet Take 1 tablet (10 mg total) by mouth daily. 11/19/23 02/17/24  Madireddy, Alean SAUNDERS, MD    Allergies: Aspirin     Review of Systems  HENT:  Positive for ear pain.     Updated Vital Signs BP 129/89   Pulse 84   Temp 98 F (36.7 C) (Oral)   Resp 18   Ht 1.854 m (6' 1)   Wt 90.7 kg   SpO2 99%   BMI 26.39 kg/m   Physical Exam Vitals and nursing note reviewed.  Constitutional:      General: He is not in acute distress.    Appearance: He is well-developed.  HENT:     Head: Normocephalic and atraumatic.     Comments: Mild tenderness palpation external ear    Right Ear: Tympanic membrane and external ear normal.     Left Ear: Tympanic  membrane and external ear normal.  Eyes:     General: No scleral icterus.       Right eye: No discharge.        Left eye: No discharge.     Conjunctiva/sclera: Conjunctivae normal.  Neck:     Trachea: No tracheal deviation.  Cardiovascular:     Rate and Rhythm: Normal rate.  Pulmonary:     Effort: Pulmonary effort is normal. No respiratory distress.     Breath sounds: No stridor.  Abdominal:     General: There is no distension.  Musculoskeletal:        General: No swelling or deformity.     Cervical back: Neck supple.  Skin:    General: Skin is warm and dry.     Findings: No rash.  Neurological:     Mental Status: He is alert. Mental status is at baseline.     Cranial Nerves: No dysarthria or facial asymmetry.     Motor: No seizure activity.     (all labs ordered are listed, but only abnormal results are displayed) Labs Reviewed - No data to display  EKG: None  Radiology: No results found.   Procedures   Medications Ordered in the ED - No data to display  Medical Decision Making Risk OTC drugs. Prescription drug management.   Patient does not have any signs of otitis media on exam.  No focal swelling noted.  He has some discomfort in the ear.  Will try a short course of antibiotic eardrops.  Will also have him take decongestants to see if that helps with his symptoms.  Recommend outpatient follow-up with ENT or PCP if symptoms persist     Final diagnoses:  Otalgia of left ear    ED Discharge Orders          Ordered    pseudoephedrine  (SUDAFED SINUS CONGESTION 12HR) 120 MG 12 hr tablet  2 times daily        04/30/24 2109    neomycin -polymyxin-hydrocortisone (CORTISPORIN) 3.5-10000-1 OTIC suspension  3 times daily        04/30/24 2109               Randol Simmonds, MD 04/30/24 2111

## 2024-04-30 NOTE — Discharge Instructions (Signed)
 Take the decongestant medication and eardrops as prescribed.  Take over-the-counter medications to help with aches and pains.  Follow-up with your primary care doctor or consider seeing an ENT doctor if the symptoms persist.
